# Patient Record
Sex: Male | Born: 1940 | Race: White | Hispanic: No | Marital: Married | Smoking: Former smoker
Health system: Southern US, Community
[De-identification: ages and names within clinical notes are randomized; demographics above are authoritative.]

## PROBLEM LIST (undated history)

## (undated) DIAGNOSIS — I10 Essential (primary) hypertension: Secondary | ICD-10-CM

## (undated) DIAGNOSIS — I700 Atherosclerosis of aorta: Secondary | ICD-10-CM

## (undated) DIAGNOSIS — E785 Hyperlipidemia, unspecified: Secondary | ICD-10-CM

## (undated) DIAGNOSIS — IMO0002 Reserved for concepts with insufficient information to code with codable children: Secondary | ICD-10-CM

## (undated) DIAGNOSIS — F329 Major depressive disorder, single episode, unspecified: Secondary | ICD-10-CM

## (undated) DIAGNOSIS — F32A Depression, unspecified: Secondary | ICD-10-CM

## (undated) DIAGNOSIS — Z8719 Personal history of other diseases of the digestive system: Secondary | ICD-10-CM

## (undated) DIAGNOSIS — N289 Disorder of kidney and ureter, unspecified: Secondary | ICD-10-CM

## (undated) DIAGNOSIS — M1990 Unspecified osteoarthritis, unspecified site: Secondary | ICD-10-CM

## (undated) DIAGNOSIS — N429 Disorder of prostate, unspecified: Secondary | ICD-10-CM

## (undated) DIAGNOSIS — K5790 Diverticulosis of intestine, part unspecified, without perforation or abscess without bleeding: Secondary | ICD-10-CM

## (undated) DIAGNOSIS — N189 Chronic kidney disease, unspecified: Secondary | ICD-10-CM

## (undated) HISTORY — DX: Disorder of prostate, unspecified: N42.9

## (undated) HISTORY — PX: SPINE SURGERY: SHX786

## (undated) HISTORY — DX: Major depressive disorder, single episode, unspecified: F32.9

## (undated) HISTORY — DX: Essential (primary) hypertension: I10

## (undated) HISTORY — PX: KYPHOPLASTY: SHX5884

## (undated) HISTORY — PX: CATARACT EXTRACTION: SUR2

## (undated) HISTORY — PX: HERNIA REPAIR: SHX51

## (undated) HISTORY — DX: Hyperlipidemia, unspecified: E78.5

## (undated) HISTORY — DX: Depression, unspecified: F32.A

---

## 2005-08-05 ENCOUNTER — Ambulatory Visit: Payer: Self-pay | Admitting: Unknown Physician Specialty

## 2005-08-13 ENCOUNTER — Ambulatory Visit: Payer: Self-pay | Admitting: Unknown Physician Specialty

## 2005-08-13 ENCOUNTER — Other Ambulatory Visit: Payer: Self-pay

## 2005-08-17 ENCOUNTER — Inpatient Hospital Stay: Payer: Self-pay | Admitting: Unknown Physician Specialty

## 2005-12-17 ENCOUNTER — Ambulatory Visit: Payer: Self-pay | Admitting: Unknown Physician Specialty

## 2009-04-17 ENCOUNTER — Ambulatory Visit: Payer: Self-pay | Admitting: Internal Medicine

## 2011-07-16 ENCOUNTER — Ambulatory Visit: Payer: Self-pay | Admitting: Unknown Physician Specialty

## 2011-11-02 ENCOUNTER — Ambulatory Visit: Payer: Self-pay | Admitting: Internal Medicine

## 2011-11-25 ENCOUNTER — Ambulatory Visit: Payer: Self-pay | Admitting: Internal Medicine

## 2012-06-28 ENCOUNTER — Emergency Department: Payer: Self-pay | Admitting: Emergency Medicine

## 2012-06-30 ENCOUNTER — Emergency Department: Payer: Self-pay | Admitting: Emergency Medicine

## 2012-07-28 ENCOUNTER — Encounter: Payer: Self-pay | Admitting: Physical Medicine & Rehabilitation

## 2012-08-01 ENCOUNTER — Ambulatory Visit: Payer: Worker's Compensation | Admitting: Physical Medicine & Rehabilitation

## 2012-08-07 ENCOUNTER — Encounter: Payer: Worker's Compensation | Attending: Physical Medicine & Rehabilitation

## 2012-08-07 ENCOUNTER — Encounter: Payer: Self-pay | Admitting: Physical Medicine & Rehabilitation

## 2012-08-07 ENCOUNTER — Ambulatory Visit (HOSPITAL_BASED_OUTPATIENT_CLINIC_OR_DEPARTMENT_OTHER): Payer: Worker's Compensation | Admitting: Physical Medicine & Rehabilitation

## 2012-08-07 ENCOUNTER — Other Ambulatory Visit: Payer: Self-pay | Admitting: Physical Medicine and Rehabilitation

## 2012-08-07 ENCOUNTER — Ambulatory Visit: Payer: Worker's Compensation | Admitting: Physical Medicine & Rehabilitation

## 2012-08-07 VITALS — BP 120/59 | HR 77 | Resp 14 | Ht 69.0 in | Wt 204.0 lb

## 2012-08-07 DIAGNOSIS — S139XXA Sprain of joints and ligaments of unspecified parts of neck, initial encounter: Secondary | ICD-10-CM

## 2012-08-07 DIAGNOSIS — E785 Hyperlipidemia, unspecified: Secondary | ICD-10-CM | POA: Insufficient documentation

## 2012-08-07 DIAGNOSIS — M5416 Radiculopathy, lumbar region: Secondary | ICD-10-CM

## 2012-08-07 DIAGNOSIS — S161XXA Strain of muscle, fascia and tendon at neck level, initial encounter: Secondary | ICD-10-CM | POA: Insufficient documentation

## 2012-08-07 DIAGNOSIS — M47812 Spondylosis without myelopathy or radiculopathy, cervical region: Secondary | ICD-10-CM | POA: Insufficient documentation

## 2012-08-07 DIAGNOSIS — M549 Dorsalgia, unspecified: Secondary | ICD-10-CM

## 2012-08-07 DIAGNOSIS — IMO0002 Reserved for concepts with insufficient information to code with codable children: Secondary | ICD-10-CM

## 2012-08-07 DIAGNOSIS — W1789XA Other fall from one level to another, initial encounter: Secondary | ICD-10-CM | POA: Insufficient documentation

## 2012-08-07 DIAGNOSIS — M5136 Other intervertebral disc degeneration, lumbar region: Secondary | ICD-10-CM

## 2012-08-07 DIAGNOSIS — M5137 Other intervertebral disc degeneration, lumbosacral region: Secondary | ICD-10-CM

## 2012-08-07 DIAGNOSIS — M48061 Spinal stenosis, lumbar region without neurogenic claudication: Secondary | ICD-10-CM | POA: Insufficient documentation

## 2012-08-07 DIAGNOSIS — I10 Essential (primary) hypertension: Secondary | ICD-10-CM | POA: Insufficient documentation

## 2012-08-07 MED ORDER — IBUPROFEN 600 MG PO TABS: 600.0000 mg | ORAL_TABLET | Freq: Three times a day (TID) | ORAL | Status: DC | PRN

## 2012-08-07 MED ORDER — TRAMADOL HCL 50 MG PO TABS: 50.0000 mg | ORAL_TABLET | Freq: Three times a day (TID) | ORAL | Status: DC | PRN

## 2012-08-07 NOTE — Progress Notes (Signed)
Subjective:    Patient ID: Shawn Stafford, male    DOB: April 25, 1941, 71 y.o.   MRN: [id number]  HPI 71 year old male who fell backward off the steps of his tractor trailer onto packed dirt on 06/27/2012. He had immediate onset of neck pain and low back pain. He had no radiating symptoms in his upper extremities. He's had pre-existing problems of numbness in both lower extremities particularly the lateral thighs. He had epidural for numbness in his legs in March of 2013 by Dr. Sharlet Salina which was beneficial. He also complains of pain around the left shoulder blade. It feels like it is underneath the shoulder blade. He underwent cervical thoracic and lumbar MRI. I reviewed both report as well as actual films. The thoracic MRI was normal. The cervical MRI With disc osteophyte complex C4-5 and C5-C6. No significant stenosis. Lumbar MRI showed disc protrusions at L3-L4 greater than L2-L3 greater than L4-L5 and L5-S1. No prior films are available for comparison Pain Inventory Average Pain 8 Pain Right Now 7 My pain is burning and aching  In the last 24 hours, has pain interfered with the following? General activity 6 Relation with others 2 Enjoyment of life 6 What TIME of day is your pain at its worst? day and evening Sleep (in general) Fair  Pain is worse with: walking, bending, sitting and standing Pain improves with: rest and medication Relief from Meds: 5  Mobility walk without assistance how many minutes can you walk? 10 ability to climb steps?  yes do you drive?  yes  Function employed # of hrs/week part time retired  Neuro/Psych bladder control problems weakness numbness trouble walking  Prior Studies x-rays  Physicians involved in your care Any changes since last visit?  no   Family History  Problem Relation Age of Onset  . Dementia Mother   . Cancer Father    History   Social History  . Marital Status: Married    Spouse Name: N/A    Number of Children: N/A  .  Years of Education: N/A   Social History Main Topics  . Smoking status: Never Smoker   . Smokeless tobacco: Never Used  . Alcohol Use: No  . Drug Use: No  . Sexually Active: None   Other Topics Concern  . None   Social History Narrative  . None   Past Surgical History  Procedure Date  . Spine surgery   . Hernia repair    Past Medical History  Diagnosis Date  . Hyperlipidemia   . Hypertension   . Prostate disorder   . Depression    Ht 5\' 9"  (1.753 m)  Wt 204 lb (92.534 kg)  BMI 30.13 kg/m2     Review of Systems  HENT: Positive for neck pain.   Musculoskeletal: Positive for myalgias, back pain and arthralgias.  Neurological: Positive for weakness and numbness.  All other systems reviewed and are negative.       Objective:   Physical Exam  Constitutional: He is oriented to person, place, and time. He appears well-developed and well-nourished.  HENT:  Head: Normocephalic and atraumatic.  Eyes: Conjunctivae normal and EOM are normal. Pupils are equal, round, and reactive to light.  Pulmonary/Chest: Breath sounds normal.  Neurological: He is alert and oriented to person, place, and time. He has normal strength. He displays no atrophy. A sensory deficit is present. He exhibits normal muscle tone. Coordination and gait normal.  Reflex Scores:      Tricep reflexes are 1+ on  the right side and 1+ on the left side.      Bicep reflexes are 1+ on the right side and 1+ on the left side.      Brachioradialis reflexes are 1+ on the right side and 1+ on the left side.      Patellar reflexes are 2+ on the right side and 2+ on the left side.      Achilles reflexes are 0 on the right side and 2+ on the left side.      Decreased bilateral L5, Decreased right L4 decreased bilateral L2-L3 sensation to pinprick  Psychiatric: He has a normal mood and affect.   No tenderness to palpation over the scapula       Assessment & Plan:  1.Cervical StrainIn the setting of cervical  spondylosis with work related injury resulting from fall onto back. There is no evidence of cervical radiculitis. MRI showing chronic changes Most notably at C4-5 and C5-C6 2. Lumbar spinal stenosis. He has chronic complaints that refer to the L3 dermatomal distribution. He has sensory findings at L4 on the right and bilateral L5 and reflex changes right S1. It is difficult to state whether these are old or new. Previous MRI would be helpful as well as previous notes however he does have aggravation due to the recent fall and requires treatment.. No further imaging studies recommended Agree with current medications. We'll take the tramadol 50 mg every 8 hours when necessary Ibuprofen 600 mg 3 times a day with food Gabapentin was a medication he has been on chronically for his chronic radicular type symptoms he is to continue that as per his physician's orders We will start him in physical therapy to help mobilize and increase his sitting tolerance. I'll see him back in approximately 2 weeks. I hope to release him to at least part-time work at that time. In the meantime he is off of work completely If he is not making progress as expected he may benefit from lumbar medial branch blocks versus lumbar epidural injection

## 2012-08-07 NOTE — Patient Instructions (Signed)
Please take the ibuprofen 600 mg 3 times per day with food Continue tramadol 50 mg 3 times a day as needed Continue gabapentin as prescribed by her other doctor Physical therapy 3 times per week until I see you next No work until I see you next. The goal would be to return you to some limited driving once your sitting tolerance improves

## 2012-08-24 ENCOUNTER — Encounter: Payer: Worker's Compensation | Attending: Physical Medicine & Rehabilitation

## 2012-08-24 ENCOUNTER — Ambulatory Visit (HOSPITAL_BASED_OUTPATIENT_CLINIC_OR_DEPARTMENT_OTHER): Payer: Worker's Compensation | Admitting: Physical Medicine & Rehabilitation

## 2012-08-24 ENCOUNTER — Encounter: Payer: Self-pay | Admitting: Physical Medicine & Rehabilitation

## 2012-08-24 VITALS — BP 146/79 | HR 69 | Resp 14 | Ht 69.0 in | Wt 204.0 lb

## 2012-08-24 DIAGNOSIS — M47812 Spondylosis without myelopathy or radiculopathy, cervical region: Secondary | ICD-10-CM | POA: Insufficient documentation

## 2012-08-24 DIAGNOSIS — M47816 Spondylosis without myelopathy or radiculopathy, lumbar region: Secondary | ICD-10-CM

## 2012-08-24 DIAGNOSIS — M48061 Spinal stenosis, lumbar region without neurogenic claudication: Secondary | ICD-10-CM | POA: Insufficient documentation

## 2012-08-24 DIAGNOSIS — E785 Hyperlipidemia, unspecified: Secondary | ICD-10-CM | POA: Insufficient documentation

## 2012-08-24 DIAGNOSIS — W1789XA Other fall from one level to another, initial encounter: Secondary | ICD-10-CM | POA: Insufficient documentation

## 2012-08-24 DIAGNOSIS — S161XXA Strain of muscle, fascia and tendon at neck level, initial encounter: Secondary | ICD-10-CM

## 2012-08-24 DIAGNOSIS — S139XXA Sprain of joints and ligaments of unspecified parts of neck, initial encounter: Secondary | ICD-10-CM | POA: Insufficient documentation

## 2012-08-24 DIAGNOSIS — I10 Essential (primary) hypertension: Secondary | ICD-10-CM | POA: Insufficient documentation

## 2012-08-24 DIAGNOSIS — M47817 Spondylosis without myelopathy or radiculopathy, lumbosacral region: Secondary | ICD-10-CM

## 2012-08-24 NOTE — Progress Notes (Signed)
  Subjective:    Patient ID: Shawn Stafford, male    DOB: 08/19/1941, 71 y.o.   MRN: [id number]  HPI  Pain Inventory Average Pain 8 Pain Right Now 6 My pain is constant, sharp and burning  In the last 24 hours, has pain interfered with the following? General activity 5 Relation with others 3 Enjoyment of life 4 What TIME of day is your pain at its worst? evening Sleep (in general) Fair  Pain is worse with: bending and standing Pain improves with: rest and pacing activities Relief from Meds: 5  Mobility walk without assistance how many minutes can you walk? 10 ability to climb steps?  yes do you drive?  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  no  Physicians involved in your care Any changes since last visit?  no   Family History  Problem Relation Age of Onset  . Dementia Mother   . Cancer Father    History   Social History  . Marital Status: Married    Spouse Name: N/A    Number of Children: N/A  . Years of Education: N/A   Social History Main Topics  . Smoking status: Never Smoker   . Smokeless tobacco: Never Used  . Alcohol Use: No  . Drug Use: No  . Sexually Active: None   Other Topics Concern  . None   Social History Narrative  . None   Past Surgical History  Procedure Date  . Spine surgery   . Hernia repair    Past Medical History  Diagnosis Date  . Hyperlipidemia   . Hypertension   . Prostate disorder   . Depression    BP 146/79  Pulse 69  Resp 14  Ht 5\' 9"  (1.753 m)  Wt 204 lb (92.534 kg)  BMI 30.13 kg/m2  SpO2 93%     Review of Systems  Musculoskeletal: Positive for myalgias, back pain and arthralgias.  All other systems reviewed and are negative.       Objective:   Physical Exam        Assessment & Plan:

## 2012-08-24 NOTE — Progress Notes (Signed)
Subjective:    Patient ID: Shawn Stafford, male    DOB: 1941-04-22, 71 y.o.   MRN: [id number]  HPI  71 year old male who fell backward off the steps of his tractor trailer onto packed dirt on 06/27/2012. He had immediate onset of neck pain and low back pain. He had no radiating symptoms in his upper extremities. He's had pre-existing problems of numbness in both lower extremities particularly the lateral thighs. He had epidural for numbness in his legs in March of 2013 by Dr. Sharlet Salina which was beneficial.  He also complains of pain around the left shoulder blade. It feels like it is underneath the shoulder blade. He underwent cervical thoracic and lumbar MRI. I reviewed both report as well as actual films. The thoracic MRI was normal. The cervical MRI With disc osteophyte complex C4-5 and C5-C6. No significant stenosis.  Lumbar MRI showed disc protrusions at L3-L4 greater than L2-L3 greater than L4-L5 and L5-S1.  Complains of tingling and burning in front of the thighs with standing as chief complaint Neck feels better 5/10still has some pain around the left shoulder but blade  Low back pain is also rated as severe 5/10  Review of Systems     Objective:   Physical Exam  Physical Exam  Constitutional: He is oriented to person, place, and time. He appears well-developed and well-nourished.  HENT:  Head: Normocephalic and atraumatic.  Eyes: Conjunctivae normal and EOM are normal. Pupils are equal, round, and reactive to light.  Pulmonary/Chest: Breath sounds normal.  Neurological: He is alert and oriented to person, place, and time. He has normal strength. He displays no atrophy. A sensory deficit is present. He exhibits normal muscle tone. Coordination and gait normal.  Reflex Scores:  Tricep reflexes are 1+ on the right side and 1+ on the left side.  Bicep reflexes are 1+ on the right side and 1+ on the left side.  Brachioradialis reflexes are 1+ on the right side and 1+ on the left side.    Patellar reflexes are 2+ on the right side and 2+ on the left side.  Achilles reflexes are 0 on the right side and 2+ on the left side. Decreased Right L5, Decreased right L4 decreased bilateral L2-L3 sensation to pinprick  Psychiatric: He has a normal mood and affect.   No tenderness to palpation over the scapula       Assessment & Plan:  1.Cervical StrainIn the setting of cervical spondylosis with work related injury resulting from fall onto back. There is no evidence of cervical radiculitis. MRI showing chronic changes Most notably at C4-5 and C5-C6  2. Lumbar spinal stenosis. He has chronic complaints that refer to the L3 dermatomal distribution. He has sensory findings at L4 on the right and bilateral L5 and reflex changes right S1. It is difficult to state whether these are old or new. Previous MRI would be helpful as well as previous notes however he does have aggravation due to the recent fall and requires treatment..  No further imaging studies recommended  Agree with current medications. We'll take the tramadol 50 mg every 8 hours when necessary  Ibuprofen 600 mg 3 times a day with food  Gabapentin was a medication he has been on chronically for his chronic radicular type symptoms he is to continue that as per his physician's orders  We will start him in physical therapy to help mobilize and increase his sitting tolerance.  Limited work hours 20 hours. Driving okay Continue physical therapy Medial  branch blocks bilateral L3-L4-L5, 2wks Continue current medications including tramadol 50 mg every 6-8 hours as needed and ibuprofen 600 mg 3 times per day with food

## 2012-08-24 NOTE — Patient Instructions (Signed)
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

## 2012-09-05 ENCOUNTER — Ambulatory Visit (HOSPITAL_BASED_OUTPATIENT_CLINIC_OR_DEPARTMENT_OTHER): Payer: Worker's Compensation | Admitting: Physical Medicine & Rehabilitation

## 2012-09-05 ENCOUNTER — Encounter: Payer: Self-pay | Admitting: Physical Medicine & Rehabilitation

## 2012-09-05 VITALS — BP 111/57 | HR 78 | Resp 14 | Ht 69.0 in | Wt 199.0 lb

## 2012-09-05 DIAGNOSIS — M47817 Spondylosis without myelopathy or radiculopathy, lumbosacral region: Secondary | ICD-10-CM

## 2012-09-05 MED ORDER — TRAZODONE HCL 50 MG PO TABS: 50.0000 mg | ORAL_TABLET | Freq: Every day | ORAL | Status: DC

## 2012-09-05 NOTE — Progress Notes (Signed)

## 2012-09-05 NOTE — Patient Instructions (Addendum)
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

## 2012-09-05 NOTE — Progress Notes (Signed)
  [facility]   Name: Shawn Stafford DOB:11/24/1940 MRN: [id number]  Date:09/05/2012  Physician: Alysia Penna, MD    Nurse/CMA: Amerie Beaumont,CMA/Carroll, CMA  Allergies: No Known Allergies  Consent Signed: yes  Is patient diabetic? no    Pregnant: no LMP: No LMP for male patient. (age 72-55)  Anticoagulants: no Anti-inflammatory: no Antibiotics: no  Procedure: Medial Branch Block  Position: Prone Start Time: 3:42pm End Time: 3:53pm  Fluoro Time: 36 seconds  RN/CMA Freddie Nghiem,CMA Carroll,CMA    Time 323pm 3:56pm    BP 111/57 150/59    Pulse 78 72    Respirations 14 14    O2 Sat 96 94%    S/S 6 6    Pain Level 8/10 8/10     D/C home with [name], patient A & O X 3, D/C instructions reviewed, and sits independently.

## 2012-09-21 ENCOUNTER — Encounter: Payer: Self-pay | Admitting: Physical Medicine & Rehabilitation

## 2012-09-21 ENCOUNTER — Ambulatory Visit (HOSPITAL_BASED_OUTPATIENT_CLINIC_OR_DEPARTMENT_OTHER): Payer: Worker's Compensation | Admitting: Physical Medicine & Rehabilitation

## 2012-09-21 VITALS — BP 124/55 | HR 86 | Resp 14 | Ht 69.0 in | Wt 198.8 lb

## 2012-09-21 DIAGNOSIS — M533 Sacrococcygeal disorders, not elsewhere classified: Secondary | ICD-10-CM

## 2012-09-21 DIAGNOSIS — M47817 Spondylosis without myelopathy or radiculopathy, lumbosacral region: Secondary | ICD-10-CM

## 2012-09-21 DIAGNOSIS — M47816 Spondylosis without myelopathy or radiculopathy, lumbar region: Secondary | ICD-10-CM | POA: Insufficient documentation

## 2012-09-21 MED ORDER — TRAMADOL HCL 50 MG PO TABS: 50.0000 mg | ORAL_TABLET | Freq: Three times a day (TID) | ORAL | Status: DC | PRN

## 2012-09-21 MED ORDER — TRAZODONE HCL 50 MG PO TABS: 50.0000 mg | ORAL_TABLET | Freq: Every day | ORAL | Status: DC

## 2012-09-21 NOTE — Patient Instructions (Signed)
Sacroiliac Joint Dysfunction The sacroiliac joint connects the lower part of the spine (the sacrum) with the bones of the pelvis. CAUSES  Sometimes, there is no obvious reason for sacroiliac joint dysfunction. Other times, it may occur   During pregnancy.  After injury, such as:  Car accidents.  Sport-related injuries.  Work-related injuries.  Due to one leg being shorter than the other.  Due to other conditions that affect the joints, such as:  Rheumatoid arthritis.  Gout.  Psoriasis.  Joint infection (septic arthritis). SYMPTOMS  Symptoms may include:  Pain in the:  Lower back.  Buttocks.  Groin.  Thighs and legs.  Difficult sitting, standing, walking, lying, bending or lifting. DIAGNOSIS  A number of tests may be used to help diagnose the cause of sacroiliac joint dysfunction, including:  Imaging tests to look for other causes of pain, including:  MRI.  CT scan.  Bone scan.  Diagnostic injection: During a special x-ray (called fluoroscopy), a needle is put into the sacroiliac joint. A numbing medicine is injected into the joint. If the pain is improved or stopped, the diagnosis of sacroiliac joint dysfunction is more likely. TREATMENT  There are a number of types of treatment used for sacroiliac joint dysfunction, including:  Only take over-the-counter or prescription medicines for pain, discomfort, or fever as directed by your caregiver.  Medications to relax muscles.  Rest. Decreasing activity can help cut down on painful muscle spasms and allow the back to heal.  Application of heat or ice to the lower back may improve muscle spasms and soothe pain.  Brace. A special back brace, called a sacroiliac belt, can help support the joint while your back is healing.  Physical therapy can help teach comfortable positions and exercises to strengthen muscles that support the sacroiliac joint.  Cortisone injections. Injections of steroid medicine into the  joint can help decrease swelling and improve pain.  Hyaluronic acid injections. This chemical improves lubrication within the sacroiliac joint, thereby decreasing pain.  Radiofrequency ablation. A special needle is placed into the joint, where it burns away nerves that are carrying pain messages from the joint.  Surgery. Because pain occurs during movement of the joint, screws and plates may be installed in order to limit or prevent joint motion. HOME CARE INSTRUCTIONS   Take all medications exactly as directed.  Follow instructions regarding both rest and physical activity, to avoid worsening the pain.  Do physical therapy exercises exactly as prescribed. SEEK IMMEDIATE MEDICAL CARE IF:  You experience increasingly severe pain.  You develop new symptoms, such as numbness or tingling in your legs or feet.  You lose bladder or bowel control. Document Released: 02/04/2009 Document Revised: 01/31/2012 Document Reviewed: 02/04/2009 [facility] Patient Information 2013 [place].

## 2012-09-21 NOTE — Progress Notes (Signed)
Subjective:    Patient ID: Shawn Stafford, male    DOB: 12-Jul-1941, 71 y.o.   MRN: [id number] 71 year old male who fell backward off the steps of his tractor trailer onto packed dirt on 06/27/2012. He had immediate onset of neck pain and low back pain. He had no radiating symptoms in his upper extremities. He's had pre-existing problems of numbness in both lower extremities particularly the lateral thighs. He had epidural for numbness in his legs in March of 2013 by Dr. Sharlet Salina which was beneficial.  He also complains of pain around the left shoulder blade. It feels like it is underneath the shoulder blade. He underwent cervical thoracic and lumbar MRI. I reviewed both report as well as actual films. The thoracic MRI was normal. The cervical MRI With disc osteophyte complex C4-5 and C5-C6. No significant stenosis.  Lumbar MRI showed disc protrusions at L3-L4 greater than L2-L3 greater than L4-L5 and L5-S1. HPI  Pain Inventory Average Pain 6 Pain Right Now 6 My pain is burning, dull and aching  In the last 24 hours, has pain interfered with the following? General activity 6 Relation with others 0 Enjoyment of life 6 What TIME of day is your pain at its worst? morning and evening Sleep (in general) Poor  Pain is worse with: bending, sitting and standing Pain improves with: heat/ice, therapy/exercise and TENS Relief from Meds: 5  Mobility walk without assistance ability to climb steps?  yes do you drive?  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  no  Physicians involved in your care Any changes since last visit?  no   Family History  Problem Relation Age of Onset  . Dementia Mother   . Cancer Father    History   Social History  . Marital Status: Married    Spouse Name: N/A    Number of Children: N/A  . Years of Education: N/A   Social History Main  Topics  . Smoking status: Never Smoker   . Smokeless tobacco: Never Used  . Alcohol Use: No  . Drug Use: No  . Sexually Active: None   Other Topics Concern  . None   Social History Narrative  . None   Past Surgical History  Procedure Date  . Spine surgery   . Hernia repair    Past Medical History  Diagnosis Date  . Hyperlipidemia   . Hypertension   . Prostate disorder   . Depression    BP 124/55  Pulse 86  Resp 14  Ht 5\' 9"  (1.753 m)  Wt 198 lb 12.8 oz (90.175 kg)  BMI 29.36 kg/m2  SpO2 94%    Review of Systems  Musculoskeletal: Positive for back pain.  Neurological: Positive for weakness and numbness.       Tingling  All other systems reviewed and are negative.       Objective:   Physical Exam  Constitutional: He is oriented to person, place, and time. He appears well-developed and well-nourished.  HENT:  Head: Normocephalic and atraumatic.  Eyes: EOM are normal. Pupils are equal, round, and reactive to light.  Musculoskeletal:       Lumbar back: He exhibits decreased range of motion.  Neurological: He is alert and oriented to person, place, and time. He has normal strength and normal reflexes. Gait normal.  Psychiatric: He has a normal mood and affect.   Tenderness over  the left PSIS area Range of motion in the lumbar spine is limited and is painful more with extension than with flexion Hip range of motion is full No pain with hip range of motion Straight leg raising test is negative Lower extremity strength is normal General no acute distress       Assessment & Plan:  1.Cervical StrainIn the setting of cervical spondylosis/Cervical degenerative disc with work related injury resulting from fall onto back. There is no evidence of cervical radiculitis. MRI showing chronic changes Most notably at C4-5 and C5-C6  He is responding to physical therapy. I would recommend traction as well as soft tissue massage and mobilization. We discussed that if he  does not respond well to physical therapy at limited trial of chiropractic adjustment may be helpful 2. Lumbar spinal stenosis. He has chronic complaints that refer to the L3 dermatomal distribution. He has sensory findings at L4 on the right and bilateral L5 and reflex changes right S1. It is difficult to state whether these are old or new. 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 He does have pain that is localized to the left PSIS area. This certainly could have been injured in the fall as well.  Recommend physical therapy mobilization as well as strengthening around the hip girdle area.2-3 times a week until I see him next in 2 weeks  Left sacroiliac injection under fluoroscopic guidance   No further imaging studies recommended   Agree with current medications. We'll take the tramadol 50 mg every 8 hours when necessary  Ibuprofen 600 mg 3 times a day with food   Gabapentin was a medication he has been on chronically for his chronic radicular type symptoms he is to continue that as per his physician's orders  .  Limited work hours But extend to 25 hours. Driving okay Continue physical therapy:Details above

## 2012-09-28 ENCOUNTER — Ambulatory Visit: Payer: Worker's Compensation | Admitting: Physical Medicine & Rehabilitation

## 2012-10-10 ENCOUNTER — Encounter: Payer: Self-pay | Admitting: Physical Medicine & Rehabilitation

## 2012-10-10 ENCOUNTER — Ambulatory Visit (HOSPITAL_BASED_OUTPATIENT_CLINIC_OR_DEPARTMENT_OTHER): Payer: Worker's Compensation | Admitting: Physical Medicine & Rehabilitation

## 2012-10-10 ENCOUNTER — Telehealth: Payer: Self-pay

## 2012-10-10 ENCOUNTER — Ambulatory Visit: Payer: Worker's Compensation | Admitting: Physical Medicine & Rehabilitation

## 2012-10-10 ENCOUNTER — Encounter: Payer: Worker's Compensation | Attending: Physical Medicine & Rehabilitation

## 2012-10-10 VITALS — BP 143/65 | HR 67 | Resp 14 | Ht 69.0 in | Wt 198.0 lb

## 2012-10-10 DIAGNOSIS — I10 Essential (primary) hypertension: Secondary | ICD-10-CM | POA: Insufficient documentation

## 2012-10-10 DIAGNOSIS — E785 Hyperlipidemia, unspecified: Secondary | ICD-10-CM | POA: Insufficient documentation

## 2012-10-10 DIAGNOSIS — M47812 Spondylosis without myelopathy or radiculopathy, cervical region: Secondary | ICD-10-CM | POA: Insufficient documentation

## 2012-10-10 DIAGNOSIS — W1789XA Other fall from one level to another, initial encounter: Secondary | ICD-10-CM | POA: Insufficient documentation

## 2012-10-10 DIAGNOSIS — M533 Sacrococcygeal disorders, not elsewhere classified: Secondary | ICD-10-CM

## 2012-10-10 DIAGNOSIS — S139XXA Sprain of joints and ligaments of unspecified parts of neck, initial encounter: Secondary | ICD-10-CM | POA: Insufficient documentation

## 2012-10-10 DIAGNOSIS — M48061 Spinal stenosis, lumbar region without neurogenic claudication: Secondary | ICD-10-CM | POA: Insufficient documentation

## 2012-10-10 NOTE — Patient Instructions (Signed)
Sacroiliac Joint Dysfunction The sacroiliac joint connects the lower part of the spine (the sacrum) with the bones of the pelvis. CAUSES  Sometimes, there is no obvious reason for sacroiliac joint dysfunction. Other times, it may occur   During pregnancy.  After injury, such as:  Car accidents.  Sport-related injuries.  Work-related injuries.  Due to one leg being shorter than the other.  Due to other conditions that affect the joints, such as:  Rheumatoid arthritis.  Gout.  Psoriasis.  Joint infection (septic arthritis). SYMPTOMS  Symptoms may include:  Pain in the:  Lower back.  Buttocks.  Groin.  Thighs and legs.  Difficult sitting, standing, walking, lying, bending or lifting. DIAGNOSIS  A number of tests may be used to help diagnose the cause of sacroiliac joint dysfunction, including:  Imaging tests to look for other causes of pain, including:  MRI.  CT scan.  Bone scan.  Diagnostic injection: During a special x-ray (called fluoroscopy), a needle is put into the sacroiliac joint. A numbing medicine is injected into the joint. If the pain is improved or stopped, the diagnosis of sacroiliac joint dysfunction is more likely. TREATMENT  There are a number of types of treatment used for sacroiliac joint dysfunction, including:  Only take over-the-counter or prescription medicines for pain, discomfort, or fever as directed by your caregiver.  Medications to relax muscles.  Rest. Decreasing activity can help cut down on painful muscle spasms and allow the back to heal.  Application of heat or ice to the lower back may improve muscle spasms and soothe pain.  Brace. A special back brace, called a sacroiliac belt, can help support the joint while your back is healing.  Physical therapy can help teach comfortable positions and exercises to strengthen muscles that support the sacroiliac joint.  Cortisone injections. Injections of steroid medicine into the  joint can help decrease swelling and improve pain.  Hyaluronic acid injections. This chemical improves lubrication within the sacroiliac joint, thereby decreasing pain.  Radiofrequency ablation. A special needle is placed into the joint, where it burns away nerves that are carrying pain messages from the joint.  Surgery. Because pain occurs during movement of the joint, screws and plates may be installed in order to limit or prevent joint motion. HOME CARE INSTRUCTIONS   Take all medications exactly as directed.  Follow instructions regarding both rest and physical activity, to avoid worsening the pain.  Do physical therapy exercises exactly as prescribed. SEEK IMMEDIATE MEDICAL CARE IF:  You experience increasingly severe pain.  You develop new symptoms, such as numbness or tingling in your legs or feet.  You lose bladder or bowel control. Document Released: 02/04/2009 Document Revised: 01/31/2012 Document Reviewed: 02/04/2009 [facility] Patient Information 2013 [place].

## 2012-10-10 NOTE — Progress Notes (Signed)
  [facility]   Name: Shawn Stafford DOB:Feb 18, 1941 MRN: [id number]  Date:10/10/2012  Physician: Alysia Penna, MD    Nurse/CMA: Vonna Drafts, CMA/Walker, CMA  Allergies: No Known Allergies  Consent Signed: yes  Is patient diabetic? no   Pregnant: no LMP: No LMP for male patient. (age 72-55)  Anticoagulants: no Anti-inflammatory: yes (600mg  motrin 10/10/12 at 9 am) Antibiotics: no  Procedure: Left Sacroiliac Steroid injection   Position: Prone Start Time:1:46   End Time: 1:49  Fluoro Time: 11 seconds  RN/CMA Namish Krise, CMA Shumaker RN    Time 1:02pm 1:55    BP 143/65 139/68    Pulse 68 62    Respirations 14 14    O2 Sat 95 92    S/S 6 6    Pain Level 6/10 0/10     D/C home with Wife-[name], patient A & O X 3, D/C instructions reviewed, and sits independently.

## 2012-10-10 NOTE — Telephone Encounter (Signed)
Patient was not sure about restriction with work and physical therapy.  Advised him he could return as he was before.

## 2012-10-10 NOTE — Progress Notes (Signed)
Left sacroiliac injection under fluoroscopic guidance  Indication: Left Low back and buttocks pain not relieved by medication management and other conservative care.  Informed consent was obtained after describing risks and benefits of the procedure with the patient, this includes bleeding, bruising, infection, paralysis and medication side effects. The patient wishes to proceed and has given written consent. The patient was placed in a prone position. The lumbar and sacral area was marked and prepped with Betadine. A 25-gauge 1-1/2 inch needle was inserted into the skin and subcutaneous tissue and 1 mL of 1% lidocaine was injected. Then a 25-gauge 3 inch spinal needle was inserted under fluoroscopic guidance into the left sacroiliac joint. AP and lateral images were utilized. Omnipaque 180x0.5 mL under live fluoroscopy demonstrated no intravascular uptake. Then a solution containing one ML of 40 mg per mL depomedrol and 2 ML of 1% lidocaine MPF was injected x1.5 mL. Patient tolerated the procedure well. Post procedure instructions were given. Please see post procedure form. 

## 2012-10-24 ENCOUNTER — Ambulatory Visit (HOSPITAL_BASED_OUTPATIENT_CLINIC_OR_DEPARTMENT_OTHER): Payer: Worker's Compensation | Admitting: Physical Medicine & Rehabilitation

## 2012-10-24 ENCOUNTER — Encounter: Payer: Worker's Compensation | Attending: Physical Medicine & Rehabilitation

## 2012-10-24 ENCOUNTER — Encounter: Payer: Self-pay | Admitting: Physical Medicine & Rehabilitation

## 2012-10-24 VITALS — BP 107/57 | HR 74 | Resp 14 | Ht 69.0 in | Wt 201.0 lb

## 2012-10-24 DIAGNOSIS — M47812 Spondylosis without myelopathy or radiculopathy, cervical region: Secondary | ICD-10-CM | POA: Insufficient documentation

## 2012-10-24 DIAGNOSIS — W1789XA Other fall from one level to another, initial encounter: Secondary | ICD-10-CM | POA: Insufficient documentation

## 2012-10-24 DIAGNOSIS — M533 Sacrococcygeal disorders, not elsewhere classified: Secondary | ICD-10-CM

## 2012-10-24 DIAGNOSIS — S139XXA Sprain of joints and ligaments of unspecified parts of neck, initial encounter: Secondary | ICD-10-CM | POA: Insufficient documentation

## 2012-10-24 DIAGNOSIS — M47817 Spondylosis without myelopathy or radiculopathy, lumbosacral region: Secondary | ICD-10-CM

## 2012-10-24 DIAGNOSIS — M48061 Spinal stenosis, lumbar region without neurogenic claudication: Secondary | ICD-10-CM | POA: Insufficient documentation

## 2012-10-24 DIAGNOSIS — E785 Hyperlipidemia, unspecified: Secondary | ICD-10-CM | POA: Insufficient documentation

## 2012-10-24 DIAGNOSIS — M47816 Spondylosis without myelopathy or radiculopathy, lumbar region: Secondary | ICD-10-CM

## 2012-10-24 DIAGNOSIS — I10 Essential (primary) hypertension: Secondary | ICD-10-CM | POA: Insufficient documentation

## 2012-10-24 NOTE — Progress Notes (Addendum)
Subjective:    Patient ID: Shawn Stafford, male    DOB: [birth year], 71 y.o.   MRN: [id number] 71 year old male who fell backward off the steps of his tractor trailer onto packed dirt on 06/27/2012. He had immediate onset of neck pain and low back pain. He had no radiating symptoms in his upper extremities. He's had pre-existing problems of numbness in both lower extremities particularly the lateral thighs. He had epidural for numbness in his legs in March of 2013 by Dr. Sharlet Salina which was beneficial.  He also complains of pain around the left shoulder blade. It feels like it is underneath the shoulder blade. He underwent cervical thoracic and lumbar MRI. I reviewed both report as well as actual films. The thoracic MRI was normal. The cervical MRI With disc osteophyte complex C4-5 and C5-C6. No significant stenosis.  Lumbar MRI showed disc protrusions at L3-L4 greater than L2-L3 greater than L4-L5 and L5-S1 HPI Left SI injection helpful Has pain on R side in a similar location  Driving at work.  7.5 hours yesterday.  Working beyond 5 hour max days Pain Inventory Average Pain 5 Pain Right Now 6 My pain is sharp and aching  In the last 24 hours, has pain interfered with the following? General activity 6 Relation with others 2 Enjoyment of life 6 What TIME of day is your pain at its worst? daytime Sleep (in general) Fair  Pain is worse with: walking, bending, standing and some activites Pain improves with: rest, heat/ice, medication, TENS and injections Relief from Meds: 5  Mobility walk without assistance how many minutes can you walk? 5-10 ability to climb steps?  yes do you drive?  yes Do you have any goals in this area?  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  no  Physicians involved in your care Any changes since last visit?  no   Family History  Problem Relation Age of Onset   . Dementia Mother   . Cancer Father    History   Social History  . Marital Status: Married    Spouse Name: N/A    Number of Children: N/A  . Years of Education: N/A   Social History Main Topics  . Smoking status: Never Smoker   . Smokeless tobacco: Never Used  . Alcohol Use: No  . Drug Use: No  . Sexually Active: None   Other Topics Concern  . None   Social History Narrative  . None   Past Surgical History  Procedure Date  . Spine surgery   . Hernia repair    Past Medical History  Diagnosis Date  . Hyperlipidemia   . Hypertension   . Prostate disorder   . Depression    BP 99/61  Pulse 74  Resp 14  Ht 5\' 9"  (1.753 m)  Wt 201 lb (91.173 kg)  BMI 29.68 kg/m2  SpO2 95%     Review of Systems  Musculoskeletal: Positive for back pain.  Neurological: Positive for weakness and numbness.  All other systems reviewed and are negative.       Objective:   Physical Exam   Tenderness over the left PSIS area Range of motion in the lumbar spine is limited and is painful more with extension than with flexion Hip range of motion is full No pain with hip range of motion Straight leg raising test is negative Lower extremity strength is normal General no acute  distress      Assessment & Plan:  1.Cervical StrainIn the setting of cervical spondylosis/Cervical degenerative disc with work related injury resulting from fall onto back. There is no evidence of cervical radiculitis. MRI showing chronic changes Most notably at C4-5 and C5-C6  He is responding to physical therapy.The symptoms have largely resolved 2. Lumbar spinal stenosis. He has chronic complaints that refer to the L3 dermatomal distribution. He has sensory findings at L4 on the right and bilateral L5 and reflex changes right S1. It is difficult to state whether these are old or new. 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 He does had pain that was localized to the left PSIS area. This responded to Left SI joint injection.  Similar findings now noted on the right Recommend physical therapy mobilization as well as strengthening around the hip girdle area.2-3 times a week Left sacroiliac injection under fluoroscopic guidance   No further imaging studies recommended   Agree with current medications. 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

## 2012-10-24 NOTE — Patient Instructions (Signed)
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

## 2012-11-07 ENCOUNTER — Ambulatory Visit (HOSPITAL_BASED_OUTPATIENT_CLINIC_OR_DEPARTMENT_OTHER): Payer: Worker's Compensation | Admitting: Physical Medicine & Rehabilitation

## 2012-11-07 ENCOUNTER — Encounter: Payer: Self-pay | Admitting: Physical Medicine & Rehabilitation

## 2012-11-07 VITALS — BP 124/66 | HR 87 | Resp 14 | Ht 69.0 in | Wt 200.0 lb

## 2012-11-07 DIAGNOSIS — M533 Sacrococcygeal disorders, not elsewhere classified: Secondary | ICD-10-CM

## 2012-11-07 NOTE — Patient Instructions (Signed)
Continue therapy at [employer] Increase work hours to 30 per week See me in 3 weeks

## 2012-11-07 NOTE — Progress Notes (Signed)
Bilateral sacroiliac injection under fluoroscopic guidance  Indication: Bilateral Low back and buttocks pain not relieved by medication management and other conservative care.  Informed consent was obtained after describing risks and benefits of the procedure with the patient, this includes bleeding, bruising, infection, paralysis and medication side effects. The patient wishes to proceed and has given written consent. The patient was placed in a prone position. The lumbar and sacral area was marked and prepped with Betadine. A 25-gauge 1-1/2 inch needle was inserted into the skin and subcutaneous tissue and 1 mL of 1% lidocaine was injected. Then a 25-gauge 3 inch spinal needle was inserted under fluoroscopic guidance into the left sacroiliac joint. AP and lateral images were utilized. Omnipaque 180x0.5 mL under live fluoroscopy demonstrated no intravascular uptake. Then a solution containing one ML of 40 mg per mL depomedrol and 2 ML of 1% lidocaine MPF was injected x1.5 mL.Procedure was performed on the right side using same needle equipment and technique Patient tolerated the procedure well. Post procedure instructions were given. Please see post procedure form.

## 2012-11-07 NOTE — Progress Notes (Signed)
  [facility]   Name: Shawn Stafford DOB:1940-11-26 MRN: [id number]  Date:11/07/2012  Physician: Alysia Penna, MD    Nurse/CMA: Vonna Drafts, CMA  Allergies: No Known Allergies  Consent Signed: yes  Is patient diabetic? no   Pregnant: no LMP: No LMP for male patient. (age 72-55)  Anticoagulants: no Anti-inflammatory: no Antibiotics: no  Procedure: bilateral sacroilliac injection  Position: Prone Start Time:  3:49 End Time:  3:56 Fluoro Time: 20  RN/CMA Levens,CMA Walker,CMA    Time 3:13pm 4:01pm    BP 124/66 157/50    Pulse 87 81    Respirations 14 14    O2 Sat 96 96    S/S 6 6    Pain Level 6 5     D/C home with [name], patient A & O X 3, D/C instructions reviewed, and sits independently.

## 2012-11-27 ENCOUNTER — Ambulatory Visit: Payer: Worker's Compensation | Admitting: Physical Medicine & Rehabilitation

## 2012-12-05 ENCOUNTER — Ambulatory Visit: Payer: Worker's Compensation | Admitting: Physical Medicine & Rehabilitation

## 2012-12-08 ENCOUNTER — Ambulatory Visit: Payer: Worker's Compensation | Admitting: Physical Medicine & Rehabilitation

## 2012-12-12 ENCOUNTER — Ambulatory Visit (HOSPITAL_BASED_OUTPATIENT_CLINIC_OR_DEPARTMENT_OTHER): Payer: Worker's Compensation | Admitting: Physical Medicine & Rehabilitation

## 2012-12-12 ENCOUNTER — Encounter: Payer: Worker's Compensation | Attending: Physical Medicine & Rehabilitation

## 2012-12-12 ENCOUNTER — Telehealth: Payer: Self-pay | Admitting: *Deleted

## 2012-12-12 ENCOUNTER — Encounter: Payer: Self-pay | Admitting: Physical Medicine & Rehabilitation

## 2012-12-12 VITALS — BP 144/62 | HR 87 | Resp 14 | Ht 69.0 in | Wt 202.6 lb

## 2012-12-12 DIAGNOSIS — S139XXA Sprain of joints and ligaments of unspecified parts of neck, initial encounter: Secondary | ICD-10-CM | POA: Insufficient documentation

## 2012-12-12 DIAGNOSIS — M48061 Spinal stenosis, lumbar region without neurogenic claudication: Secondary | ICD-10-CM | POA: Insufficient documentation

## 2012-12-12 DIAGNOSIS — E785 Hyperlipidemia, unspecified: Secondary | ICD-10-CM | POA: Insufficient documentation

## 2012-12-12 DIAGNOSIS — I10 Essential (primary) hypertension: Secondary | ICD-10-CM | POA: Insufficient documentation

## 2012-12-12 DIAGNOSIS — M47816 Spondylosis without myelopathy or radiculopathy, lumbar region: Secondary | ICD-10-CM

## 2012-12-12 DIAGNOSIS — W1789XA Other fall from one level to another, initial encounter: Secondary | ICD-10-CM | POA: Insufficient documentation

## 2012-12-12 DIAGNOSIS — M47817 Spondylosis without myelopathy or radiculopathy, lumbosacral region: Secondary | ICD-10-CM

## 2012-12-12 DIAGNOSIS — M47812 Spondylosis without myelopathy or radiculopathy, cervical region: Secondary | ICD-10-CM | POA: Insufficient documentation

## 2012-12-12 DIAGNOSIS — M533 Sacrococcygeal disorders, not elsewhere classified: Secondary | ICD-10-CM

## 2012-12-12 MED ORDER — TRAMADOL HCL 50 MG PO TABS: 50.0000 mg | ORAL_TABLET | Freq: Three times a day (TID) | ORAL | Status: DC | PRN

## 2012-12-12 NOTE — Progress Notes (Signed)
Subjective:    Patient ID: Shawn Stafford, male    DOB: 1941/10/03, 72 y.o.   MRN: [id number] 72 year old male who fell backward off the steps of his tractor trailer onto packed dirt on 06/27/2012. He had immediate onset of neck pain and low back pain. He had no radiating symptoms in his upper extremities. He's had pre-existing problems of numbness in both lower extremities particularly the lateral thighs. He had epidural for numbness in his legs in March of 2013 by Dr. Sharlet Salina which was beneficial.  He also complains of pain around the left shoulder blade. It feels like it is underneath the shoulder blade. He underwent cervical thoracic and lumbar MRI. I reviewed both report as well as actual films. The thoracic MRI was normal. The cervical MRI With disc osteophyte complex C4-5 and C5-C6. No significant stenosis.  Lumbar MRI showed disc protrusions at L3-L4 greater than L2-L3 greater than L4-L5 and L5-S1 HPI  Pain Inventory Average Pain 4 Pain Right Now 3 My pain is burning and aching  In the last 24 hours, has pain interfered with the following? General activity 3 Relation with others 2 Enjoyment of life 2 What TIME of day is your pain at its worst? morning and evening Sleep (in general) Fair  Pain is worse with: walking, bending and standing Pain improves with: rest, heat/ice, medication and TENS Relief from Meds: 7  Mobility walk without assistance how many minutes can you walk? 10-15 ability to climb steps?  yes do you drive?  yes transfers alone Do you have any goals in this area?  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  yes  Neuro/Psych weakness  Prior Studies Any changes since last visit?  no  Physicians involved in your care Any changes since last visit?  no   Family History  Problem Relation Age of Onset  . Dementia Mother   . Cancer Father    History   Social  History  . Marital Status: Married    Spouse Name: N/A    Number of Children: N/A  . Years of Education: N/A   Social History Main Topics  . Smoking status: Never Smoker   . Smokeless tobacco: Never Used  . Alcohol Use: No  . Drug Use: No  . Sexually Active: None   Other Topics Concern  . None   Social History Narrative  . None   Past Surgical History  Procedure Date  . Spine surgery   . Hernia repair    Past Medical History  Diagnosis Date  . Hyperlipidemia   . Hypertension   . Prostate disorder   . Depression    BP 144/62  Pulse 87  Resp 14  Ht 5\' 9"  (1.753 m)  Wt 202 lb 9.6 oz (91.899 kg)  BMI 29.92 kg/m2   Review of Systems  Musculoskeletal: Positive for back pain.  Neurological: Positive for weakness.  All other systems reviewed and are negative.       Objective:   Physical Exam   Mild Tenderness over the left PSIS area Range of motion in the lumbar spine is limited and is painful more with extension than with flexion Hip range of motion is full No pain with hip range of motion Straight leg raising test is negative Lower extremity strength is normal General no acute distress      Assessment & Plan:  1.Cervical StrainIn the setting  of cervical spondylosis/Cervical degenerative disc with work related injury resulting from fall onto back. There is no evidence of cervical radiculitis. MRI showing chronic changes Most notably at C4-5 and C5-C6  He is responding to physical therapy.The symptoms have largely resolved 2. Lumbar spinal stenosis. He has chronic complaints that refer to the L3 dermatomal distribution. He has sensory findings at L4 on the right and bilateral L5 and reflex changes right S1. It is difficult to state whether these are old or new. 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 He does had pain that was localized  to the left PSIS area. This responded to bilateral SI joint injection. No further imaging studies recommended   Agree with current medications. We'll take the tramadol 50 mg every 8 hours when necessary  Ibuprofen 600 mg 3 times a day with food   Gabapentin was a medication he has been on chronically for his chronic radicular type symptoms he is to continue that as per his physician's orders  May drive 35 hours a week Anticipate increasing this next visit, Anticipate MMI next visit

## 2012-12-12 NOTE — Patient Instructions (Addendum)
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

## 2012-12-12 NOTE — Telephone Encounter (Signed)
Helene Shoe, Case Manager would like update on patients status

## 2012-12-13 NOTE — Telephone Encounter (Signed)
Called and spoke with Helene Shoe, Case, Manager. She was looking over patients Visit Summary and it states that Dr. Letta Pate will continue to follow patient every 6 months for his Tramadol. Adjuster is wanting to know after patient is released for MMI can the PCP continue to prescribe Tramadol instead of patient being seen in our office every 6 months for life.

## 2012-12-13 NOTE — Telephone Encounter (Signed)
Left detailed message on [facility] Voice Mail.

## 2012-12-13 NOTE — Telephone Encounter (Signed)
I am seeing the pt back in 3 weeks, PCP or his PMR Dr Sharlet Salina can Rx Tramadol instead of me

## 2013-01-02 ENCOUNTER — Ambulatory Visit: Payer: Worker's Compensation | Admitting: Physical Medicine & Rehabilitation

## 2013-01-05 ENCOUNTER — Ambulatory Visit: Payer: Worker's Compensation | Admitting: Physical Medicine & Rehabilitation

## 2013-01-16 ENCOUNTER — Ambulatory Visit (HOSPITAL_BASED_OUTPATIENT_CLINIC_OR_DEPARTMENT_OTHER): Payer: Worker's Compensation | Admitting: Physical Medicine & Rehabilitation

## 2013-01-16 ENCOUNTER — Encounter: Payer: Worker's Compensation | Attending: Physical Medicine & Rehabilitation

## 2013-01-16 ENCOUNTER — Encounter: Payer: Self-pay | Admitting: Physical Medicine & Rehabilitation

## 2013-01-16 VITALS — BP 114/51 | HR 72 | Resp 14 | Ht 69.0 in | Wt 201.4 lb

## 2013-01-16 DIAGNOSIS — M47812 Spondylosis without myelopathy or radiculopathy, cervical region: Secondary | ICD-10-CM | POA: Insufficient documentation

## 2013-01-16 DIAGNOSIS — S161XXD Strain of muscle, fascia and tendon at neck level, subsequent encounter: Secondary | ICD-10-CM

## 2013-01-16 DIAGNOSIS — M47816 Spondylosis without myelopathy or radiculopathy, lumbar region: Secondary | ICD-10-CM

## 2013-01-16 DIAGNOSIS — S139XXA Sprain of joints and ligaments of unspecified parts of neck, initial encounter: Secondary | ICD-10-CM | POA: Insufficient documentation

## 2013-01-16 DIAGNOSIS — I10 Essential (primary) hypertension: Secondary | ICD-10-CM | POA: Insufficient documentation

## 2013-01-16 DIAGNOSIS — M48061 Spinal stenosis, lumbar region without neurogenic claudication: Secondary | ICD-10-CM | POA: Insufficient documentation

## 2013-01-16 DIAGNOSIS — E785 Hyperlipidemia, unspecified: Secondary | ICD-10-CM | POA: Insufficient documentation

## 2013-01-16 DIAGNOSIS — M47817 Spondylosis without myelopathy or radiculopathy, lumbosacral region: Secondary | ICD-10-CM

## 2013-01-16 DIAGNOSIS — Z5189 Encounter for other specified aftercare: Secondary | ICD-10-CM

## 2013-01-16 DIAGNOSIS — W1789XA Other fall from one level to another, initial encounter: Secondary | ICD-10-CM | POA: Insufficient documentation

## 2013-01-16 DIAGNOSIS — IMO0002 Reserved for concepts with insufficient information to code with codable children: Secondary | ICD-10-CM

## 2013-01-16 MED ORDER — TRAMADOL HCL 50 MG PO TABS: 50.0000 mg | ORAL_TABLET | Freq: Three times a day (TID) | ORAL | Status: AC | PRN

## 2013-01-16 NOTE — Progress Notes (Signed)
Subjective:    Patient ID: Shawn Stafford, male    DOB: September 14, 1941, 72 y.o.   MRN: [id number]  HPI 72 year old male who fell backward off the steps of his tractor trailer onto packed dirt on 06/27/2012. He had immediate onset of neck pain and low back pain. He had no radiating symptoms in his upper extremities. He's had pre-existing problems of numbness in both lower extremities particularly the lateral thighs. He had epidural for numbness in his legs in March of 2013 by Dr. Sharlet Salina which was beneficial.  He also complains of pain around the left shoulder blade. It feels like it is underneath the shoulder blade. He underwent cervical thoracic and lumbar MRI. I reviewed both report as well as actual films. The thoracic MRI was normal. The cervical MRI With disc osteophyte complex C4-5 and C5-C6. No significant stenosis.   Lumbar MRI showed disc protrusions at L3-L4 greater than L2-L3 greater than L4-L5 and L5-S1  Pain Inventory Average Pain 4 Pain Right Now 3 My pain is sharp and burning  In the last 24 hours, has pain interfered with the following? General activity 2 Relation with others 1 Enjoyment of life 1 What TIME of day is your pain at its worst? evening Sleep (in general) Fair  Pain is worse with: sitting and standing Pain improves with: rest, therapy/exercise, medication and TENS Relief from Meds: 7  Mobility walk without assistance  Function employed # of hrs/week 35  Neuro/Psych trouble walking  Prior Studies Any changes since last visit?  no  Physicians involved in your care Any changes since last visit?  no   Family History  Problem Relation Age of Onset  . Dementia Mother   . Cancer Father    History   Social History  . Marital Status: Married    Spouse Name: N/A    Number of Children: N/A  . Years of Education: N/A   Social History Main Topics  . Smoking status: Never Smoker   . Smokeless tobacco: Never Used  . Alcohol Use: No  . Drug Use: No  .  Sexually Active: None   Other Topics Concern  . None   Social History Narrative  . None   Past Surgical History  Procedure Laterality Date  . Spine surgery    . Hernia repair     Past Medical History  Diagnosis Date  . Hyperlipidemia   . Hypertension   . Prostate disorder   . Depression    BP 114/51  Pulse 72  Resp 14  Ht 5\' 9"  (1.753 m)  Wt 201 lb 6.4 oz (91.354 kg)  BMI 29.73 kg/m2  SpO2 96%    Review of Systems  HENT: Positive for neck pain.   Musculoskeletal: Positive for back pain.  All other systems reviewed and are negative.       Objective:   Physical Exam  Mild Tenderness over the left PSIS area  Range of motion in the lumbar spine is limited and is painful more with extension than with flexion  Hip range of motion is full  No pain with hip range of motion  Straight leg raising test is negative  Lower extremity strength is normal  General no acute distress       Assessment & Plan:  1.Cervical StrainIn the setting of cervical spondylosis/Cervical degenerative disc with work related injury resulting from fall onto back. There is no evidence of cervical radiculitis. MRI showing chronic changes Most notably at C4-5 and C5-C6  He is  responding to physical therapy.The symptoms have largely resolved  2. Lumbar spinal stenosis. He has chronic complaints that refer to the L3 dermatomal distribution. He has sensory findings at L4 on the right and bilateral L5 and reflex changes right S1. It is difficult to state whether these are old or new.  3.Lumbar spondylosis which responded to lumbar medial branch blocks L3-L4 as well as L5 dorsal ramus injection 50% reduction of pain  4. 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 He does had pain that was localized to the left PSIS area. This responded to bilateral SI joint injection. No further imaging studies recommended  Agree with current medications.  Ibuprofen 400mg  Tramadol 50mg  BID May work 40 hrs/week 2% PPD RTC prn

## 2013-01-16 NOTE — Patient Instructions (Addendum)
Ibuprofen 400mg  Tramadol 50mg  BID May work 40 hrs/week 2% PPD RTC prn

## 2013-02-12 ENCOUNTER — Emergency Department: Payer: Self-pay | Admitting: Emergency Medicine

## 2013-02-12 LAB — CBC
HCT: 40.9 % (ref 40.0–52.0)
HGB: 14.2 g/dL (ref 13.0–18.0)
MCH: 32.5 pg (ref 26.0–34.0)
MCHC: 34.6 g/dL (ref 32.0–36.0)
MCV: 94 fL (ref 80–100)
Platelet: 204 10*3/uL (ref 150–440)
RBC: 4.36 10*6/uL — ABNORMAL LOW (ref 4.40–5.90)
WBC: 10.7 10*3/uL — ABNORMAL HIGH (ref 3.8–10.6)

## 2013-02-12 LAB — PROTIME-INR: Prothrombin Time: 14 secs (ref 11.5–14.7)

## 2013-02-12 LAB — TROPONIN I: Troponin-I: <0.02 ng/mL

## 2013-02-12 LAB — COMPREHENSIVE METABOLIC PANEL
Albumin: 3.8 g/dL (ref 3.4–5.0)
Alkaline Phosphatase: 50 U/L (ref 50–136)
Bilirubin,Total: 0.4 mg/dL (ref 0.2–1.0)
Calcium, Total: 9 mg/dL (ref 8.5–10.1)
Chloride: 108 mmol/L — ABNORMAL HIGH (ref 98–107)
Co2: 26 mmol/L (ref 21–32)
Osmolality: 284 (ref 275–301)
Potassium: 4.6 mmol/L (ref 3.5–5.1)
SGOT(AST): 29 U/L (ref 15–37)
Sodium: 139 mmol/L (ref 136–145)
Total Protein: 7 g/dL (ref 6.4–8.2)

## 2013-02-15 ENCOUNTER — Telehealth: Payer: Self-pay

## 2013-02-15 NOTE — Telephone Encounter (Signed)
[facility] Gu[facility] patients case manager needs office note from 2/25 faxed to her at [phone]

## 2013-02-15 NOTE — Telephone Encounter (Signed)
Office note faxed to [place].

## 2013-12-17 ENCOUNTER — Telehealth: Payer: Self-pay

## 2013-12-17 NOTE — Telephone Encounter (Signed)
Will need visit with PA, opioid agreement, Tramadol 50mg  po BID

## 2013-12-17 NOTE — Telephone Encounter (Signed)
Pharmacy request for Tramadol was sent over. Patient has not been seen since 12/2012. Is this okay to refill?

## 2013-12-20 NOTE — Telephone Encounter (Signed)
Contacted patient to inform him that he would need a OV because he has not been seen since 12/2012. Patient states this is a WC case and he will have to get the appt. Approved through them. Patient will call back to make the appt. Patient also said he was going to go to another MD for a second opinion which WC has already approved. Tramadol was not refilled.

## 2014-02-11 ENCOUNTER — Ambulatory Visit: Payer: Self-pay | Admitting: Orthopedic Surgery

## 2014-02-11 LAB — CBC
HCT: 42.6 % (ref 40.0–52.0)
HGB: 14.6 g/dL (ref 13.0–18.0)
MCH: 31.9 pg (ref 26.0–34.0)
MCHC: 34.3 g/dL (ref 32.0–36.0)
MCV: 93 fL (ref 80–100)
Platelet: 194 10*3/uL (ref 150–440)
RBC: 4.57 10*6/uL (ref 4.40–5.90)
RDW: 12.9 % (ref 11.5–14.5)
WBC: 5.8 10*3/uL (ref 3.8–10.6)

## 2014-02-11 LAB — BASIC METABOLIC PANEL
ANION GAP: 6 — AB (ref 7–16)
BUN: 22 mg/dL — ABNORMAL HIGH (ref 7–18)
CREATININE: 1.67 mg/dL — AB (ref 0.60–1.30)
Calcium, Total: 9.1 mg/dL (ref 8.5–10.1)
Chloride: 107 mmol/L (ref 98–107)
Co2: 25 mmol/L (ref 21–32)
EGFR (African American): 46 — ABNORMAL LOW
EGFR (Non-African Amer.): 40 — ABNORMAL LOW
GLUCOSE: 93 mg/dL (ref 65–99)
Osmolality: 279 (ref 275–301)
Potassium: 4.4 mmol/L (ref 3.5–5.1)
SODIUM: 138 mmol/L (ref 136–145)

## 2014-02-11 LAB — APTT: ACTIVATED PTT: 31.8 s (ref 23.6–35.9)

## 2014-02-11 LAB — PROTIME-INR
INR: 1.1
Prothrombin Time: 14.1 secs (ref 11.5–14.7)

## 2014-02-14 ENCOUNTER — Ambulatory Visit: Payer: Self-pay | Admitting: Orthopedic Surgery

## 2015-03-15 NOTE — Op Note (Signed)
PATIENT NAME:  Shawn Stafford, SIPP MR#:  [id number] DATE OF BIRTH:  09/11/1941  DATE OF PROCEDURE:  02/14/2014  PREOPERATIVE DIAGNOSES: Right shoulder rotator cuff tear, acromioclavicular joint arthrosis, subacromial impingement and possible biceps tendon tear.   POSTOPERATIVE DIAGNOSIS: Right shoulder rotator cuff tear involving the supraspinatus, a high-grade partial tear thickness, acromioclavicular joint arthrosis, subacromial impingement (chronic) and superior labral tear with biceps tendinosis.   SURGERY: Right shoulder arthroscopic subacromial decompression, distal clavicle excision and biceps tenotomy with mini-open rotator cuff repair.   ANESTHESIA: General with interscalene block and local anesthetic with 1% lidocaine plain and 0.25% Marcaine plain.   SURGEON: Thornton Park, M.D.   ESTIMATED BLOOD LOSS: Minimal.   COMPLICATIONS: None.   IMPLANTS: ArthroCare Opus Magnum-2 anchors x 2 and Magnum-M anchors x 2.   INDICATIONS FOR PROCEDURE: Mr. Calame is a 74 year old male, who has had persistent pain in his right shoulder that has not responded to nonoperative management including nonsteroidal anti-inflammatories, subacromial injection as well as physical therapy and rest and activity modification. Given his failure to respond to conservative management, it was recommend that the patient proceed with repair of the rotator cuff tear as documented by MRI.   PROCEDURE NOTE: The patient was met in the preoperative area. The right shoulder was marked with the word "yes" according to the hospital's right side protocol. His history and physical was updated. He was then brought to the operating room where he underwent placement of an interscalene block and then general endotracheal intubation. He was placed in a beach chair position. All bony prominences were adequately padded. The patient was then prepped and draped in a sterile fashion.   A timeout was performed to verify the patient's name,  date of birth, medical record number, correct site of surgery and correct procedure to be performed. It was also used to verify the patient had received antibiotics and that all appropriate instruments, implants, and radiographic studies were available in the room. Once all in attendance were in agreement, the case began.   The examination under anesthesia revealed full passive range of motion and no instability to load and shift testing and he had negative sulcus sign.   The bony landmarks were drawn out with a surgical marker along with proposed arthroscopy incisions. These were pre-injected with 1% lidocaine plain. An 11 blade was used to create a posterior portal. The arthroscope was placed into the glenohumeral joint through this portal. Under direct visualization using an 18-gauge spinal needle, the patient had anterior portal created as well. A 5.75 mm arthroscopic cannula was placed through the anterior portal for assistance with diagnostic portion of the exam. The patient had a full glenohumeral joint evaluation. Findings on arthroscopy included a high-grade partial-thickness tear involving the supraspinatus. There was significant thickening of the biceps tendon consistent with tendinosis. There was also a superior labral tear with superior labral fraying. This extended into the posterior-superior labrum was as well. The anterior-inferior labrum was intact. The subscapularis had mild fraying of the superior fibers, but there was no full-thickness tear. There is no evidence of loose bodies or HAGL lesion in the inferior recess. The patient did have degenerative changes diffusely involving the glenoid and humeral head without full-thickness cartilage loss.   A biceps tenolysis was performed arthroscopically using an arthroscopic scissor and 90 degree ArthroCare wands. An 18-gauge spinal needle was then used to mark the partial-thickness tear of the supraspinatus for identification on the bursal side. A  4.0 resector shaver blade was used through  the anterior portal to debride the superior labral tear as well.   The arthroscope was then placed into the subacromial space. A lateral portal was created again using an 18-gauge spinal needle for localization. An extensive bursectomy was performed using a 4.0 resector shaver blade and ArthroCare wand. The location of the tear was easily identified with the 0 PDS from the bursal side. The rotator cuff tear was completed using a 4.0 resector shaver blade. A 5.5 mm resector shaver blade was then used through the lateral portal to perform a subacromial decompression. It was then placed through the anterior portal after the cannula was removed to perform a distal clavicle excision. The subacromial space was then cleared of all bony debris. Two Opus Smart stitches were then placed on the lateral margin of the rotator cuff. Final arthroscopic images were taken.   All arthroscopic instruments were then removed. A saber-type incision was made along the lateral border of the acromion. The subcutaneous tissue was dissected using a Metzenbaum scissor and pick-up along with electrocautery. The deltoid fascia was identified. The deltoid was split in line with its fibers. The Smart stitches were brought out through the deltoid split. A self-retaining retractor was placed to allow for visualization of the rotator cuff tear. A 5.5 mm resector shaver blade was then used to remove all remaining debris of the supraspinatus from the greater tuberosity. Punctate bleeding was identified. However, the greater tuberosity was not decorticated.   Two Magnum-M anchors were placed at the articular margin with the humeral head. These were passed through the medial aspect of the rotator cuff using a first-pass suture passer. These were clamped for later repair. Then, two Magnum-2 anchors were placed lateral to the greater tuberosity and then tensioned to allow for excellent approximation of the  lateral aspect of the rotator cuff. Once the cuff had been reduced to its original position, the sutures of the Magnum-M anchors were tied down manually for a double row repair. The arm was then rotated and the tear moved as a single unit. There were no loose edges or "dog ears." The wound was copiously irrigated. Final images of the rotator cuff repair were taken both externally and arthroscopically from the glenohumeral joint. After the wounds were copiously irrigated, the deltoid fascia was closed with 0 Vicryl, the subcutaneous tissue was closed with 2-0 Vicryl and the skin approximated with a running 3-0 clear Monocryl. The 3 arthroscopy portals were closed with 4-0 nylon. Steri-Strips were applied along with a dry sterile dressing, TENS unit leads and a Polar Care sleeve. The patient was placed in an abduction sling. He was awoken and brought to the PACU in stable condition. I was scrubbed and present for the entire case. All sharp and instrument counts were correct at the conclusion of the case. I spoke with the patient's family member postoperatively and let them know that the patient was stable in the recovery room and the case had gone without complication.   ____________________________ Timoteo Gaul, MD [id number] D: 02/19/2014 11:22:56 ET T: 02/19/2014 11:42:28 ET JOB#: [id number]  cc: Timoteo Gaul, MD, <Dictator> Timoteo Gaul MD ELECTRONICALLY SIGNED 02/22/2014 20:05

## 2015-12-31 DIAGNOSIS — Z0000 Encounter for general adult medical examination without abnormal findings: Secondary | ICD-10-CM | POA: Diagnosis not present

## 2015-12-31 DIAGNOSIS — E782 Mixed hyperlipidemia: Secondary | ICD-10-CM | POA: Diagnosis not present

## 2016-01-06 DIAGNOSIS — H401131 Primary open-angle glaucoma, bilateral, mild stage: Secondary | ICD-10-CM | POA: Diagnosis not present

## 2016-01-07 DIAGNOSIS — N183 Chronic kidney disease, stage 3 (moderate): Secondary | ICD-10-CM | POA: Diagnosis not present

## 2016-01-07 DIAGNOSIS — I10 Essential (primary) hypertension: Secondary | ICD-10-CM | POA: Diagnosis not present

## 2016-01-07 DIAGNOSIS — E782 Mixed hyperlipidemia: Secondary | ICD-10-CM | POA: Diagnosis not present

## 2016-01-23 DIAGNOSIS — L300 Nummular dermatitis: Secondary | ICD-10-CM | POA: Diagnosis not present

## 2016-01-23 DIAGNOSIS — L821 Other seborrheic keratosis: Secondary | ICD-10-CM | POA: Diagnosis not present

## 2016-03-18 DIAGNOSIS — B86 Scabies: Secondary | ICD-10-CM | POA: Diagnosis not present

## 2016-03-28 ENCOUNTER — Inpatient Hospital Stay
Admission: EM | Admit: 2016-03-28 | Discharge: 2016-03-31 | DRG: 440 | Disposition: A | Discharge status: Home / Self Care | Payer: PPO | Attending: Internal Medicine | Admitting: Internal Medicine

## 2016-03-28 ENCOUNTER — Emergency Department: Payer: PPO

## 2016-03-28 ENCOUNTER — Encounter: Payer: Self-pay | Admitting: *Deleted

## 2016-03-28 ENCOUNTER — Inpatient Hospital Stay: Payer: PPO

## 2016-03-28 DIAGNOSIS — R1013 Epigastric pain: Secondary | ICD-10-CM | POA: Diagnosis not present

## 2016-03-28 DIAGNOSIS — I129 Hypertensive chronic kidney disease with stage 1 through stage 4 chronic kidney disease, or unspecified chronic kidney disease: Secondary | ICD-10-CM | POA: Diagnosis present

## 2016-03-28 DIAGNOSIS — K859 Acute pancreatitis without necrosis or infection, unspecified: Principal | ICD-10-CM | POA: Diagnosis present

## 2016-03-28 DIAGNOSIS — E6609 Other obesity due to excess calories: Secondary | ICD-10-CM | POA: Diagnosis present

## 2016-03-28 DIAGNOSIS — E785 Hyperlipidemia, unspecified: Secondary | ICD-10-CM | POA: Diagnosis not present

## 2016-03-28 DIAGNOSIS — R000 Tachycardia, unspecified: Secondary | ICD-10-CM | POA: Diagnosis not present

## 2016-03-28 DIAGNOSIS — I10 Essential (primary) hypertension: Secondary | ICD-10-CM | POA: Diagnosis not present

## 2016-03-28 DIAGNOSIS — N400 Enlarged prostate without lower urinary tract symptoms: Secondary | ICD-10-CM | POA: Diagnosis not present

## 2016-03-28 DIAGNOSIS — K760 Fatty (change of) liver, not elsewhere classified: Secondary | ICD-10-CM | POA: Diagnosis not present

## 2016-03-28 DIAGNOSIS — R1010 Upper abdominal pain, unspecified: Secondary | ICD-10-CM | POA: Diagnosis not present

## 2016-03-28 DIAGNOSIS — Z79899 Other long term (current) drug therapy: Secondary | ICD-10-CM

## 2016-03-28 DIAGNOSIS — N183 Chronic kidney disease, stage 3 (moderate): Secondary | ICD-10-CM | POA: Diagnosis present

## 2016-03-28 DIAGNOSIS — N189 Chronic kidney disease, unspecified: Secondary | ICD-10-CM | POA: Diagnosis not present

## 2016-03-28 DIAGNOSIS — Z6827 Body mass index (BMI) 27.0-27.9, adult: Secondary | ICD-10-CM

## 2016-03-28 HISTORY — DX: Reserved for concepts with insufficient information to code with codable children: IMO0002

## 2016-03-28 HISTORY — DX: Chronic kidney disease, unspecified: N18.9

## 2016-03-28 LAB — CBC
HCT: 42.4 % (ref 40.0–52.0)
Hemoglobin: 14.6 g/dL (ref 13.0–18.0)
MCH: 31.7 pg (ref 26.0–34.0)
MCHC: 34.5 g/dL (ref 32.0–36.0)
MCV: 91.9 fL (ref 80.0–100.0)
PLATELETS: 203 10*3/uL (ref 150–440)
RBC: 4.62 MIL/uL (ref 4.40–5.90)
RDW: 13 % (ref 11.5–14.5)
WBC: 13.7 10*3/uL — AB (ref 3.8–10.6)

## 2016-03-28 LAB — LIPID PANEL
Cholesterol: 143 mg/dL (ref 0–200)
HDL: 30 mg/dL — ABNORMAL LOW (ref >40)
LDL CALC: 88 mg/dL (ref 0–99)
TRIGLYCERIDES: 124 mg/dL (ref <150)
Total CHOL/HDL Ratio: 4.8 RATIO
VLDL: 25 mg/dL (ref 0–40)

## 2016-03-28 LAB — URINALYSIS COMPLETE WITH MICROSCOPIC (ARMC ONLY)
BILIRUBIN URINE: NEGATIVE
Bacteria, UA: NONE SEEN
Glucose, UA: 150 mg/dL — AB
Hgb urine dipstick: NEGATIVE
LEUKOCYTES UA: NEGATIVE
NITRITE: NEGATIVE
PH: 7 (ref 5.0–8.0)
Protein, ur: 100 mg/dL — AB
RBC / HPF: NONE SEEN RBC/hpf (ref 0–5)
Specific Gravity, Urine: 1.015 (ref 1.005–1.030)
Squamous Epithelial / LPF: NONE SEEN
WBC, UA: NONE SEEN WBC/hpf (ref 0–5)

## 2016-03-28 LAB — COMPREHENSIVE METABOLIC PANEL
ALK PHOS: 46 U/L (ref 38–126)
ALT: 32 U/L (ref 17–63)
AST: 32 U/L (ref 15–41)
Albumin: 4.5 g/dL (ref 3.5–5.0)
Anion gap: 9 (ref 5–15)
BILIRUBIN TOTAL: 1 mg/dL (ref 0.3–1.2)
BUN: 20 mg/dL (ref 6–20)
CALCIUM: 9.1 mg/dL (ref 8.9–10.3)
CO2: 23 mmol/L (ref 22–32)
CREATININE: 1.41 mg/dL — AB (ref 0.61–1.24)
Chloride: 107 mmol/L (ref 101–111)
GFR, EST AFRICAN AMERICAN: 54 mL/min — AB (ref >60)
GFR, EST NON AFRICAN AMERICAN: 47 mL/min — AB (ref >60)
Glucose, Bld: 165 mg/dL — ABNORMAL HIGH (ref 65–99)
Potassium: 4.2 mmol/L (ref 3.5–5.1)
Sodium: 139 mmol/L (ref 135–145)
TOTAL PROTEIN: 7.1 g/dL (ref 6.5–8.1)

## 2016-03-28 LAB — TROPONIN I: Troponin I: <0.03 ng/mL (ref <0.031)

## 2016-03-28 LAB — LIPASE, BLOOD: Lipase: 7177 U/L — ABNORMAL HIGH (ref 11–51)

## 2016-03-28 MED ORDER — ADULT MULTIVITAMIN W/MINERALS CH
1.0000 | ORAL_TABLET | Freq: Every day | ORAL | Status: DC
  Administered 2016-03-30 – 2016-03-31 (×2): 1 via ORAL
  Filled 2016-03-28 (×3): qty 1

## 2016-03-28 MED ORDER — MORPHINE SULFATE (PF) 2 MG/ML IV SOLN
2.0000 mg | Freq: Once | INTRAVENOUS | Status: DC
  Filled 2016-03-28: qty 1

## 2016-03-28 MED ORDER — MORPHINE SULFATE (PF) 4 MG/ML IV SOLN
4.0000 mg | Freq: Once | INTRAVENOUS | Status: AC
  Administered 2016-03-28: 4 mg via INTRAVENOUS
  Filled 2016-03-28: qty 1

## 2016-03-28 MED ORDER — HYDROCODONE-ACETAMINOPHEN 5-325 MG PO TABS
1.0000 | ORAL_TABLET | ORAL | Status: DC | PRN
  Administered 2016-03-28 (×2): 2 via ORAL
  Administered 2016-03-29: 1 via ORAL
  Administered 2016-03-29 – 2016-03-31 (×7): 2 via ORAL
  Filled 2016-03-28 (×5): qty 2
  Filled 2016-03-28: qty 1
  Filled 2016-03-28 (×4): qty 2

## 2016-03-28 MED ORDER — TAMSULOSIN HCL 0.4 MG PO CAPS
0.4000 mg | ORAL_CAPSULE | Freq: Every day | ORAL | Status: DC
Start: 2016-03-28 — End: 2016-03-31
  Administered 2016-03-28 – 2016-03-31 (×4): 0.4 mg via ORAL
  Filled 2016-03-28 (×4): qty 1

## 2016-03-28 MED ORDER — SODIUM CHLORIDE 0.9 % IV BOLUS (SEPSIS)
500.0000 mL | INTRAVENOUS | Status: AC
  Administered 2016-03-28: 500 mL via INTRAVENOUS

## 2016-03-28 MED ORDER — OMEGA-3 FATTY ACIDS 1000 MG PO CAPS: 2.0000 g | ORAL_CAPSULE | Freq: Every day | ORAL | Status: DC

## 2016-03-28 MED ORDER — ENOXAPARIN SODIUM 40 MG/0.4ML ~~LOC~~ SOLN
40.0000 mg | SUBCUTANEOUS | Status: DC
  Administered 2016-03-28 – 2016-03-30 (×3): 40 mg via SUBCUTANEOUS
  Filled 2016-03-28 (×3): qty 0.4

## 2016-03-28 MED ORDER — ONDANSETRON HCL 4 MG PO TABS: 4.0000 mg | ORAL_TABLET | Freq: Four times a day (QID) | ORAL | Status: DC | PRN

## 2016-03-28 MED ORDER — SODIUM CHLORIDE 0.9 % IV SOLN
INTRAVENOUS | Status: DC
  Administered 2016-03-28 – 2016-03-30 (×4): via INTRAVENOUS

## 2016-03-28 MED ORDER — VITAMIN B-12 1000 MCG PO TABS
1000.0000 ug | ORAL_TABLET | Freq: Every day | ORAL | Status: DC
  Administered 2016-03-30 – 2016-03-31 (×2): 1000 ug via ORAL
  Filled 2016-03-28 (×3): qty 1

## 2016-03-28 MED ORDER — DIATRIZOATE MEGLUMINE & SODIUM 66-10 % PO SOLN
15.0000 mL | ORAL | Status: AC
  Administered 2016-03-28: 15 mL via ORAL

## 2016-03-28 MED ORDER — OMEGA-3-ACID ETHYL ESTERS 1 G PO CAPS
2.0000 g | ORAL_CAPSULE | Freq: Every day | ORAL | Status: DC
Start: 2016-03-28 — End: 2016-03-31
  Administered 2016-03-30 – 2016-03-31 (×2): 2 g via ORAL
  Filled 2016-03-28 (×3): qty 2

## 2016-03-28 MED ORDER — ACETAMINOPHEN 650 MG RE SUPP: 650.0000 mg | Freq: Four times a day (QID) | RECTAL | Status: DC | PRN

## 2016-03-28 MED ORDER — ONDANSETRON HCL 4 MG/2ML IJ SOLN
4.0000 mg | Freq: Once | INTRAMUSCULAR | Status: AC
Start: 2016-03-28 — End: 2016-03-28
  Administered 2016-03-28: 4 mg via INTRAVENOUS
  Filled 2016-03-28: qty 2

## 2016-03-28 MED ORDER — HYDROMORPHONE HCL 1 MG/ML IJ SOLN
2.0000 mg | INTRAMUSCULAR | Status: DC | PRN
  Administered 2016-03-28 – 2016-03-29 (×4): 2 mg via INTRAVENOUS
  Filled 2016-03-28 (×4): qty 2

## 2016-03-28 MED ORDER — ACETAMINOPHEN 325 MG PO TABS: 650.0000 mg | ORAL_TABLET | Freq: Four times a day (QID) | ORAL | Status: DC | PRN

## 2016-03-28 MED ORDER — SODIUM CHLORIDE 0.9% FLUSH
3.0000 mL | Freq: Two times a day (BID) | INTRAVENOUS | Status: DC
  Administered 2016-03-29 – 2016-03-31 (×3): 3 mL via INTRAVENOUS

## 2016-03-28 MED ORDER — SIMVASTATIN 20 MG PO TABS
20.0000 mg | ORAL_TABLET | Freq: Every evening | ORAL | Status: DC
  Administered 2016-03-28 – 2016-03-30 (×3): 20 mg via ORAL
  Filled 2016-03-28 (×3): qty 1

## 2016-03-28 MED ORDER — ONDANSETRON HCL 4 MG/2ML IJ SOLN
4.0000 mg | Freq: Four times a day (QID) | INTRAMUSCULAR | Status: DC | PRN
  Administered 2016-03-28 – 2016-03-29 (×2): 4 mg via INTRAVENOUS
  Filled 2016-03-28 (×2): qty 2

## 2016-03-28 NOTE — ED Notes (Signed)
Pt reports having onset of "gastric reflux" pain this morning that he treated with alka seltzers without relief. Pt reports currently having upper right abd and right sided back pain. Pt reports having one episode of vomiting of yellow bile. Pt denies nausea at this morning. Pt reports walking increases the pain.

## 2016-03-28 NOTE — ED Provider Notes (Signed)
[facility] Emergency Department Provider Note  ____________________________________________  Time seen: Approximately 11:55 AM  I have reviewed the triage vital signs and the nursing notes.   HISTORY  Chief Complaint Abdominal Pain and Back Pain    HPI Shawn Stafford is a 75 y.o. male with a past medical history that includes hypertension, hyperlipidemia, and unspecified prostate disorder but no significant cardiac history and no history of aortic abnormalities.  He presents by private vehicle today for evaluation of acute onset epigastric pain that started this morning.  He states he has been in the normal state of health over the last couple days and felt fine when he went to bed last night.  After he awoke this morning and had a banana for breakfast, he acutely developed mild to moderate burning in his epigastrium which he attributed to gas.  He took some acid reflux medication but it did not help.  He went to church and the symptoms got gradually worse over time.  Subsequently he went to the bathroom and he has vomited twice, each time producing only yellow bile.  The nausea has since resolved but the abdominal pain persists and it is severe.  He describes it as sharp and stabbing as well as burning and occasionally radiates to his back.  He has had no numbness or tingling in his extremities and denies chest pain and shortness of breath.  The pain is worse in his upper abdomen in the middle.  He has never had any abdominal surgeries including never having his gallbladder removed.He denies dysuria and hematuria.   Past Medical History  Diagnosis Date  . Hyperlipidemia   . Hypertension   . Prostate disorder   . Depression     Patient Active Problem List   Diagnosis Date Noted  . Lumbar spondylosis 09/21/2012  . Cervical spondylosis 08/07/2012  . Cervical strain 08/07/2012  . 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 Spine surgery    . Hernia repair      Current Outpatient Rx  Name  Route  Sig  Dispense  Refill  . fish oil-omega-3 fatty acids 1000 MG capsule   Oral   Take 2 g by mouth daily.         Marland Kitchen ibuprofen (ADVIL,MOTRIN) 600 MG tablet   Oral   Take 1 tablet (600 mg total) by mouth every 8 (eight) hours as needed.   90 tablet   1   . lisinopril (PRINIVIL,ZESTRIL) 10 MG tablet   Oral   Take 10 mg by mouth daily.         . Multiple Vitamin (MULTIVITAMIN) tablet   Oral   Take 1 tablet by mouth daily.         . simvastatin (ZOCOR) 20 MG tablet   Oral   Take 20 mg by mouth every evening.         . Tamsulosin HCl (FLOMAX) 0.4 MG CAPS   Oral   Take 0.4 mg by mouth daily.         . traMADol (ULTRAM) 50 MG tablet   Oral   Take 1 tablet (50 mg total) by mouth every 8 (eight) hours as needed for pain.   90 tablet   5   . vitamin B-12 (CYANOCOBALAMIN) 1000 MCG tablet   Oral   Take 1,000 mcg by mouth daily.  Allergies Review of patient's allergies indicates no known allergies.  Family History  Problem Relation Age of Onset  . Dementia Mother   . Cancer Father     Social History Social History  Substance Use Topics  . Smoking status: Never Smoker   . Smokeless tobacco: Never Used  . Alcohol Use: No    Review of Systems Constitutional: No fever/chills Eyes: No visual changes. ENT: No sore throat. Cardiovascular: Denies chest pain. Respiratory: Denies shortness of breath. Gastrointestinal: +abd pain worse upper than lower, N/V (emesis x 2), no diarrhea Genitourinary: Negative for dysuria. Musculoskeletal: Negative for back pain. Skin: Negative for rash. Neurological: Negative for headaches, focal weakness or numbness.  10-point ROS otherwise negative.  ____________________________________________   PHYSICAL EXAM:  VITAL SIGNS: ED Triage Vitals  Enc Vitals Group     BP 03/28/16 1142 138/83  mmHg     Pulse Rate 03/28/16 1142 70     Resp 03/28/16 1142 20     Temp 03/28/16 1142 97.8 F (36.6 C)     Temp Source 03/28/16 1142 Oral     SpO2 03/28/16 1142 99 %     Weight 03/28/16 1142 186 lb (84.369 kg)     Height 03/28/16 1142 5\' 9"  (1.753 m)     Head Cir --      Peak Flow --      Pain Score 03/28/16 1134 9     Pain Loc --      Pain Edu? --      Excl. in [place]? --     Constitutional: Alert and oriented. Well appearing and in no acute distress. Eyes: Conjunctivae are normal. PERRL. EOMI. Head: Atraumatic. Nose: No congestion/rhinnorhea. Mouth/Throat: Mucous membranes are moist.  Oropharynx non-erythematous. Neck: No stridor.  No meningeal signs.   Cardiovascular: Normal rate, regular rhythm. Good peripheral circulation. Grossly normal heart sounds.   Respiratory: Normal respiratory effort.  No retractions. Lungs CTAB. Gastrointestinal: Soft With severe tenderness to palpation of the epigastrium and right upper quadrant with positive Murphy sign.  However he is also severely tender in the lower abdomen on both sides although slightly less so than the upper.  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  No right lower quadrant tenderness to palpation. Musculoskeletal: No lower extremity tenderness nor edema. No gross deformities of extremities. Neurologic:  Normal speech and language. No gross focal neurologic deficits are appreciated.  Skin:  Skin is warm, dry and intact. No rash noted. Psychiatric: Mood and affect are normal. Speech and behavior are normal.  ____________________________________________   LABS (all labs ordered are listed, but only abnormal results are displayed)  Labs Reviewed  LIPASE, BLOOD - Abnormal; Notable for the following:    Lipase 7177 (*)    All other components within normal limits  COMPREHENSIVE METABOLIC PANEL - Abnormal; Notable for the following:    Glucose, Bld 165 (*)    Creatinine, Ser  1.41 (*)    GFR calc non Af Amer 47 (*)    GFR calc Af Amer 54 (*)    All other components within normal limits  CBC - Abnormal; Notable for the following:    WBC 13.7 (*)    All other components within normal limits  TROPONIN I  URINALYSIS COMPLETEWITH MICROSCOPIC ([facility] ONLY)   ____________________________________________  EKG  ED ECG REPORT I, Rikita Grabert, the attending physician, personally viewed and interpreted this ECG.  Date: 03/28/2016 EKG Time: 12:10 Rate:  66 Rhythm: normal sinus rhythm QRS Axis: normal Intervals: normal ST/T Wave abnormalities: normal Conduction Disturbances: none Narrative Interpretation: unremarkable  ____________________________________________  RADIOLOGY   US Abdomen Complete  03/28/2016  CLINICAL DATA:  Right upper quadrant pain. EXAM: ABDOMEN ULTRASOUND COMPLETE COMPARISON:  None. FINDINGS: Gallbladder: No gallstones or wall thickening visualized. No sonographic Murphy sign noted by sonographer. Common bile duct: Diameter: 2.4 mm Liver: Increased echogenicity, likely hepatic steatosis. IVC: No abnormality visualized. Pancreas: Visualized portion unremarkable. Spleen: Size and appearance within normal limits. Right Kidney: Length: 10.3 cm. Linear hyperechogenicity, possibly a nonobstructing stone. Left Kidney: Length: 11.9 cm. Echogenicity within normal limits. No mass or hydronephrosis visualized. Abdominal aorta: No aneurysm visualized. Other findings: None. IMPRESSION: 1. Hepatic steatosis. 2. Possible nonobstructing stone in the right kidney. Electronically Signed   By: Dorise Bullion III M.D   On: 03/28/2016 13:48    ____________________________________________   PROCEDURES  Procedure(s) performed: None  Critical Care performed: No ____________________________________________   INITIAL IMPRESSION / ASSESSMENT AND PLAN / ED COURSE  Pertinent labs & imaging results that were available during my care of the patient were reviewed by  me and considered in my medical decision making (see chart for details).  12:17 PM:  My initial impression is that gallbladder disease is most likely although certainly for this demographic group I must consider cardiac and aorta high on the differential as well.  His vital signs are reassuring and although he is clearly uncomfortable he is in no acute distress at this time.  I will evaluate him with stat right upper quadrant and aortic ultrasounds.  If more imaging is required (if gallbladder is unremarkable or if the aorta is abnormal), I will proceed with CT imaging, either angio or w/ venous contrast.  Discussed w/ patient and wife who understand and agree with plan.  +morphine and zofran, small fluid bolus.  ----------------------------------------- 2:03 PM on 03/28/2016 -----------------------------------------  Pain has not improved, will give another dose of morphine.  The patient's workup is notable for a very elevated lipase but a normal and reassuring at upper quadrant ultrasound.  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  However, he did state that he recently completed a course of medication prescribed by his dermatologist to treat scabies.  He just finished the course of treatment yesterday.  I asked him and his wife to figure out what this medication was that they could tell the hospitalist and I did explain this to the hospitalist as well.  ____________________________________________  FINAL CLINICAL IMPRESSION(S) / ED DIAGNOSES  Final diagnoses:  Acute pancreatitis, unspecified complication status, unspecified pancreatitis type     MEDICATIONS GIVEN DURING THIS VISIT:  Medications  morphine 4 MG/ML injection 4 mg (not administered)  morphine 4 MG/ML injection 4 mg (4 mg Intravenous Given 03/28/16 1329)  ondansetron (ZOFRAN) injection 4 mg (4 mg Intravenous Given 03/28/16 1328)  sodium chloride 0.9 % bolus 500 mL (500 mLs Intravenous New Bag/Given 03/28/16 1328)     NEW  OUTPATIENT MEDICATIONS STARTED DURING THIS VISIT:  New Prescriptions   No medications on file      Note:  This document was prepared using Dragon voice recognition software and may include unintentional dictation errors.   Hinda Kehr, MD 03/28/16 [phone]

## 2016-03-28 NOTE — H&P (Signed)
Shawn Stafford NAME: Shawn Stafford    MR#:  [id number]  DATE OF BIRTH:  08-21-1941  DATE OF ADMISSION:  03/28/2016  PRIMARY CARE PHYSICIAN: Dr. Emily Filbert  REQUESTING/REFERRING PHYSICIAN: Dr. Hinda Kehr  CHIEF COMPLAINT:   Chief Complaint  Patient presents with  . Abdominal Pain  . Back Pain    HISTORY OF PRESENT ILLNESS:  Shawn Stafford  is a 75 y.o. male with a known history of HTN, Hyperlipidemia, BPH, CKD presents from home secondary to acute abdominal pain associated with nausea and vomiting that started this morning. Patient has been in his normal state of health up until today morning. He had regular supper last night denies any symptoms, feeling sick to stomach last night before he went to bed. This morning he woke up and had breakfast and few minutes later started to have significant epigastric and right upper quadrant pain that radiated all over the abdomen and he started to have nausea and several episodes of vomiting. Denies any diarrhea. Denies any fevers or chills. No recent travel. No history of gallstones. Does not drink alcohol. Ultrasound of the abdomen here showed normal liver and gallbladder. Lipase is elevated at greater than 7177 is being admitted for acute pancreatitis. He was also started on antihelminthic for scabies by dermatologist last week. Possibly ivermectin because the family said he needed to take just 2 doses. Finished his last dose yesterday.  PAST MEDICAL HISTORY:   Past Medical History  Diagnosis Date  . Hyperlipidemia   . Hypertension   . Prostate disorder   . Depression   . Compression fracture   . CKD (chronic kidney disease)     PAST SURGICAL HISTORY:   Past Surgical History  Procedure Laterality Date  . Spine surgery    . Hernia repair    . Cataract extraction    . Kyphoplasty      SOCIAL HISTORY:   Social History  Substance Use Topics  . Smoking status: Never Smoker    . Smokeless tobacco: Never Used  . Alcohol Use: No    FAMILY HISTORY:   Family History  Problem Relation Age of Onset  . Dementia Mother   . Colon cancer Father     DRUG ALLERGIES:   Allergies  Allergen Reactions  . Fentanyl     PATCH CAUSES RASH    REVIEW OF SYSTEMS:   Review of Systems  Constitutional: Positive for malaise/fatigue. Negative for fever, chills and weight loss.  HENT: Negative for ear discharge, ear pain, nosebleeds and tinnitus.   Eyes: Negative for blurred vision, double vision and photophobia.  Respiratory: Negative for cough, hemoptysis, shortness of breath and wheezing.   Cardiovascular: Negative for chest pain, palpitations, orthopnea and leg swelling.  Gastrointestinal: Positive for nausea, vomiting and abdominal pain. Negative for heartburn, diarrhea, constipation and melena.  Genitourinary: Negative for dysuria, urgency, frequency and hematuria.  Musculoskeletal: Positive for back pain. Negative for myalgias and neck pain.  Skin: Negative for rash.  Neurological: Negative for dizziness, tingling, tremors, sensory change, speech change, focal weakness and headaches.  Endo/Heme/Allergies: Does not bruise/bleed easily.  Psychiatric/Behavioral: Negative for depression.    MEDICATIONS AT HOME:   Prior to Admission medications   Medication Sig Start Date End Date Taking? Authorizing Provider  fish oil-omega-3 fatty acids 1000 MG capsule Take 2 g by mouth daily.    Historical Provider, MD  ibuprofen (ADVIL,MOTRIN) 600 MG tablet Take 1 tablet (600 mg total)  by mouth every 8 (eight) hours as needed. 08/07/12   Charlett Blake, MD  lisinopril (PRINIVIL,ZESTRIL) 10 MG tablet Take 10 mg by mouth daily.    Historical Provider, MD  Multiple Vitamin (MULTIVITAMIN) tablet Take 1 tablet by mouth daily.    Historical Provider, MD  simvastatin (ZOCOR) 20 MG tablet Take 20 mg by mouth every evening.    Historical Provider, MD  Tamsulosin HCl (FLOMAX) 0.4 MG CAPS  Take 0.4 mg by mouth daily.    Historical Provider, MD  traMADol (ULTRAM) 50 MG tablet Take 1 tablet (50 mg total) by mouth every 8 (eight) hours as needed for pain. 01/16/13   Charlett Blake, MD  vitamin B-12 (CYANOCOBALAMIN) 1000 MCG tablet Take 1,000 mcg by mouth daily.    Historical Provider, MD      VITAL SIGNS:  Blood pressure 146/79, pulse 59, temperature 97.8 F (36.6 C), temperature source Oral, resp. rate 18, height 5\' 9"  (1.753 m), weight 84.369 kg (186 lb), SpO2 93 %.  PHYSICAL EXAMINATION:   Physical Exam  GENERAL:  75 y.o.-year-old patient lying in the bed with no acute distress.  EYES: Pupils equal, round, reactive to light and accommodation. No scleral icterus. Extraocular muscles intact.  HEENT: Head atraumatic, normocephalic. Oropharynx and nasopharynx clear.  NECK:  Supple, no jugular venous distention. No thyroid enlargement, no tenderness.  LUNGS: Normal breath sounds bilaterally, no wheezing, rales,rhonchi or crepitation. No use of accessory muscles of respiration.  CARDIOVASCULAR: S1, S2 normal. No murmurs, rubs, or gallops.  ABDOMEN: Soft, Tender in the right upper quadrant, epigastric and also left lower quadrants. No guarding or rigidity. nondistended. Bowel sounds present. No organomegaly or mass.  EXTREMITIES: No pedal edema, cyanosis, or clubbing.  NEUROLOGIC: Cranial nerves II through XII are intact. Muscle strength 5/5 in all extremities. Sensation intact. Gait not checked.  PSYCHIATRIC: The patient is alert and oriented x 3.  SKIN: No obvious rash, lesion, or ulcer.   LABORATORY PANEL:   CBC  Recent Labs Lab 03/28/16 1149  WBC 13.7*  HGB 14.6  HCT 42.4  PLT 203   ------------------------------------------------------------------------------------------------------------------  Chemistries   Recent Labs Lab 03/28/16 1149  NA 139  K 4.2  CL 107  CO2 23  GLUCOSE 165*  BUN 20  CREATININE 1.41*  CALCIUM 9.1  AST 32  ALT 32  ALKPHOS  46  BILITOT 1.0   ------------------------------------------------------------------------------------------------------------------  Cardiac Enzymes  Recent Labs Lab 03/28/16 1149  TROPONINI <0.03   ------------------------------------------------------------------------------------------------------------------  RADIOLOGY:  US Abdomen Complete  03/28/2016  CLINICAL DATA:  Right upper quadrant pain. EXAM: ABDOMEN ULTRASOUND COMPLETE COMPARISON:  None. FINDINGS: Gallbladder: No gallstones or wall thickening visualized. No sonographic Murphy sign noted by sonographer. Common bile duct: Diameter: 2.4 mm Liver: Increased echogenicity, likely hepatic steatosis. IVC: No abnormality visualized. Pancreas: Visualized portion unremarkable. Spleen: Size and appearance within normal limits. Right Kidney: Length: 10.3 cm. Linear hyperechogenicity, possibly a nonobstructing stone. Left Kidney: Length: 11.9 cm. Echogenicity within normal limits. No mass or hydronephrosis visualized. Abdominal aorta: No aneurysm visualized. Other findings: None. IMPRESSION: 1. Hepatic steatosis. 2. Possible nonobstructing stone in the right kidney. Electronically Signed   By: Dorise Bullion III M.D   On: 03/28/2016 13:48    EKG:   Orders placed or performed during the hospital encounter of 03/28/16  . ED EKG  . ED EKG    IMPRESSION AND PLAN:   Gabreal Howick  is a 75 y.o. male with a known history of HTN, Hyperlipidemia, BPH, CKD  presents from home secondary to acute abdominal pain associated with nausea and vomiting that started this morning.  #1 acute pancreatitis-likely idiopathic at this time. -Lipid panel is pending. No alcohol use history. Also no gallstones noted on ultrasound of the abdomen. -CT of the abdomen is pending. If necrosis noted or severe inflammation noted, will add IV antibiotics. -Possibly was on ivermectin recently-side effect profile does not indicate pancreatitis. Pharmacy to verify the  medicine he used. -Keep him nothing by mouth. IV fluids. Pain and nausea medicines. -Continue to monitor closely.  #2 hypertension-hold enalapril  #3 hyperlipidemia-on statin and fish oil.  #4 BPH-on Flomax  #5 DVT prophylaxis-on Lovenox   All the records are reviewed and case discussed with ED provider. Management plans discussed with the patient, family and they are in agreement.  CODE STATUS: Full code  TOTAL TIME TAKING CARE OF THIS PATIENT: 50 minutes.    Gladstone Lighter M.D on 03/28/2016 at 2:39 PM  Between 7am to 6pm - Pager - [phone]  After 6pm go to [email or web address] - password [email or web address] [facility]  [facility]  Office  [phone]  CC: Primary care physician; No primary care provider on file.

## 2016-03-29 DIAGNOSIS — E785 Hyperlipidemia, unspecified: Secondary | ICD-10-CM | POA: Diagnosis not present

## 2016-03-29 DIAGNOSIS — K859 Acute pancreatitis without necrosis or infection, unspecified: Secondary | ICD-10-CM | POA: Diagnosis not present

## 2016-03-29 DIAGNOSIS — I10 Essential (primary) hypertension: Secondary | ICD-10-CM | POA: Diagnosis not present

## 2016-03-29 DIAGNOSIS — N189 Chronic kidney disease, unspecified: Secondary | ICD-10-CM | POA: Diagnosis not present

## 2016-03-29 LAB — CBC
HCT: 47 % (ref 40.0–52.0)
Hemoglobin: 16.1 g/dL (ref 13.0–18.0)
MCH: 31.5 pg (ref 26.0–34.0)
MCHC: 34.2 g/dL (ref 32.0–36.0)
MCV: 92.1 fL (ref 80.0–100.0)
PLATELETS: 194 10*3/uL (ref 150–440)
RBC: 5.1 MIL/uL (ref 4.40–5.90)
RDW: 13.4 % (ref 11.5–14.5)
WBC: 21.3 10*3/uL — ABNORMAL HIGH (ref 3.8–10.6)

## 2016-03-29 LAB — BASIC METABOLIC PANEL
ANION GAP: 11 (ref 5–15)
BUN: 18 mg/dL (ref 6–20)
CALCIUM: 8.3 mg/dL — AB (ref 8.9–10.3)
CO2: 22 mmol/L (ref 22–32)
Chloride: 104 mmol/L (ref 101–111)
Creatinine, Ser: 1.06 mg/dL (ref 0.61–1.24)
GFR calc Af Amer: >60 mL/min (ref >60)
GLUCOSE: 156 mg/dL — AB (ref 65–99)
Potassium: 4.1 mmol/L (ref 3.5–5.1)
Sodium: 137 mmol/L (ref 135–145)

## 2016-03-29 LAB — LIPASE, BLOOD: LIPASE: 993 U/L — AB (ref 11–51)

## 2016-03-29 LAB — MAGNESIUM: MAGNESIUM: 1.5 mg/dL — AB (ref 1.7–2.4)

## 2016-03-29 MED ORDER — HYDROMORPHONE HCL 1 MG/ML IJ SOLN
1.0000 mg | INTRAMUSCULAR | Status: DC | PRN
  Administered 2016-03-29 – 2016-03-30 (×4): 1 mg via INTRAVENOUS
  Filled 2016-03-29 (×4): qty 1

## 2016-03-29 MED ORDER — MAGNESIUM SULFATE 4 GM/100ML IV SOLN
4.0000 g | Freq: Once | INTRAVENOUS | Status: AC
  Administered 2016-03-29: 4 g via INTRAVENOUS
  Filled 2016-03-29: qty 100

## 2016-03-29 MED ORDER — AMLODIPINE BESYLATE 10 MG PO TABS
10.0000 mg | ORAL_TABLET | Freq: Every day | ORAL | Status: DC
  Administered 2016-03-29 – 2016-03-31 (×3): 10 mg via ORAL
  Filled 2016-03-29 (×3): qty 1

## 2016-03-29 MED ORDER — LORATADINE 10 MG PO TABS
10.0000 mg | ORAL_TABLET | Freq: Every day | ORAL | Status: DC | PRN
  Administered 2016-03-29: 10 mg via ORAL
  Filled 2016-03-29: qty 1

## 2016-03-29 NOTE — Progress Notes (Addendum)
[facility] [place] NAME: Shawn Stafford    MR#:  [id number]  DATE OF BIRTH:  March 19, 1941  SUBJECTIVE:  CHIEF COMPLAINT:   Chief Complaint  Patient presents with  . Abdominal Pain  . Back Pain   Still has abdominal pain, constant, 6-8/10, sharp without radiation. REVIEW OF SYSTEMS:  CONSTITUTIONAL: No fever, fatigue or weakness.  EYES: No blurred or double vision.  EARS, NOSE, AND THROAT: No tinnitus or ear pain.  RESPIRATORY: No cough, shortness of breath, wheezing or hemoptysis.  CARDIOVASCULAR: No chest pain, orthopnea, edema.  GASTROINTESTINAL: has abdominal pain, nausea,  No vomiting or diarrhea. GENITOURINARY: No dysuria, hematuria.  ENDOCRINE: No polyuria, nocturia,  HEMATOLOGY: No anemia, easy bruising or bleeding SKIN: No rash or lesion. MUSCULOSKELETAL: No joint pain or arthritis.   NEUROLOGIC: No tingling, numbness, weakness.  PSYCHIATRY: No anxiety or depression.   DRUG ALLERGIES:   Allergies  Allergen Reactions  . Fentanyl     PATCH CAUSES RASH    VITALS:  Blood pressure 156/80, pulse 93, temperature 99 F (37.2 C), temperature source Oral, resp. rate 18, height 5\' 9"  (1.753 m), weight 84.369 kg (186 lb), SpO2 89 %.  PHYSICAL EXAMINATION:  GENERAL:  75 y.o.-year-old patient lying in the bed with no acute distress.  EYES: Pupils equal, round, reactive to light and accommodation. No scleral icterus. Extraocular muscles intact.  HEENT: Head atraumatic, normocephalic. Oropharynx and nasopharynx clear.  NECK:  Supple, no jugular venous distention. No thyroid enlargement, no tenderness.  LUNGS: Normal breath sounds bilaterally, no wheezing, rales,rhonchi or crepitation. No use of accessory muscles of respiration.  CARDIOVASCULAR: S1, S2 normal. No murmurs, rubs, or gallops.  ABDOMEN: Soft, Diffuse tenderness, nondistended. Bowel sounds present. No organomegaly or mass.  EXTREMITIES: No pedal edema, cyanosis, or  clubbing.  NEUROLOGIC: Cranial nerves II through XII are intact. Muscle strength 5/5 in all extremities. Sensation intact. Gait not checked.  PSYCHIATRIC: The patient is alert and oriented x 3.  SKIN: No obvious rash, lesion, or ulcer.    LABORATORY PANEL:   CBC  Recent Labs Lab 03/29/16 0454  WBC 21.3*  HGB 16.1  HCT 47.0  PLT 194   ------------------------------------------------------------------------------------------------------------------  Chemistries   Recent Labs Lab 03/28/16 1149 03/29/16 0454  NA 139 137  K 4.2 4.1  CL 107 104  CO2 23 22  GLUCOSE 165* 156*  BUN 20 18  CREATININE 1.41* 1.06  CALCIUM 9.1 8.3*  MG  --  1.5*  AST 32  --   ALT 32  --   ALKPHOS 46  --   BILITOT 1.0  --    ------------------------------------------------------------------------------------------------------------------  Cardiac Enzymes  Recent Labs Lab 03/28/16 1149  TROPONINI <0.03   ------------------------------------------------------------------------------------------------------------------  RADIOLOGY:  Ct Abdomen Pelvis Wo Contrast  03/28/2016  CLINICAL DATA:  Upper abdominal pain since 08/30 this morning. Nausea and vomiting. Elevated lipase. EXAM: CT ABDOMEN AND PELVIS WITHOUT CONTRAST TECHNIQUE: Multidetector CT imaging of the abdomen and pelvis was performed following the standard protocol without IV contrast. COMPARISON:  None. FINDINGS: Lower chest: Dependent subpleural atelectasis but no infiltrates or effusions. The heart is normal in size. Extensive coronary artery calcifications. The distal esophagus is grossly normal. Hepatobiliary: No focal hepatic lesions or intrahepatic biliary dilatation. The gallbladder is unremarkable. No common bile duct dilatation. Pancreas: Extensive inflammation above and surrounding the pancreas consistent with acute pancreatitis. There is also inflammation of the second and third portions of the duodenum. Small amount of fluid  noted in the anterior pararenal space on both sides. No pancreatic calcifications. No obvious common bile duct stone. Spleen: Normal size.  No focal lesions. Adrenals/Urinary Tract: The adrenal glands and kidneys are unremarkable. No renal or obstructing ureteral calculi. No hydronephrosis. Stomach/Bowel: The stomach is unremarkable. There is inflammation surrounding the second and third portions of the duodenum. The small bowel and colon are unremarkable. No obstructive findings or mass lesions. The terminal ileum is normal. The appendix is normal. Vascular/Lymphatic: No mesenteric or retroperitoneal mass or adenopathy. Small scattered lymph nodes are noted. There are dense atherosclerotic calcifications involving the aorta and branch vessel ostia. No aneurysm. Other: The prostate gland is mildly enlarged. The seminal vesicles appear normal. No pelvic mass or adenopathy. No free pelvic fluid collections. No inguinal mass or adenopathy. Musculoskeletal: No significant bony findings. Vertebral augmentation changes noted at L3 and L4. IMPRESSION: 1. CT findings consistent with acute pancreatitis. 2. Advanced atherosclerotic calcifications involving the aorta and branch vessels and coronary arteries. Electronically Signed   By: Marijo Sanes M.D.   On: 03/28/2016 17:20   US Abdomen Complete  03/28/2016  CLINICAL DATA:  Right upper quadrant pain. EXAM: ABDOMEN ULTRASOUND COMPLETE COMPARISON:  None. FINDINGS: Gallbladder: No gallstones or wall thickening visualized. No sonographic Murphy sign noted by sonographer. Common bile duct: Diameter: 2.4 mm Liver: Increased echogenicity, likely hepatic steatosis. IVC: No abnormality visualized. Pancreas: Visualized portion unremarkable. Spleen: Size and appearance within normal limits. Right Kidney: Length: 10.3 cm. Linear hyperechogenicity, possibly a nonobstructing stone. Left Kidney: Length: 11.9 cm. Echogenicity within normal limits. No mass or hydronephrosis visualized.  Abdominal aorta: No aneurysm visualized. Other findings: None. IMPRESSION: 1. Hepatic steatosis. 2. Possible nonobstructing stone in the right kidney. Electronically Signed   By: Dorise Bullion III M.D   On: 03/28/2016 13:48    EKG:   Orders placed or performed during the hospital encounter of 03/28/16  . ED EKG  . ED EKG    ASSESSMENT AND PLAN:   Kahari Mould is a 75 y.o. male with a known history of HTN, Hyperlipidemia, BPH, CKD presents from home secondary to acute abdominal pain associated with nausea and vomiting that started this morning.  #1 acute pancreatitis-likely idiopathic at this time. -Lipid panel is Normal. No alcohol use history. Also no gallstones noted on ultrasound of the abdomen. -CT of the abdomen: Acute pancreatitis. Lipase is improving. I started clear liquid diet this morning but the patient could not tolerate and is still has abdominal pain.so keep nothing by mouth. IV fluids. Pain and nausea medicines. Follow-up lipase.  #2 hypertension-hold enalapril, Start Norvasc.  #3 hyperlipidemia-on statin and fish oil.  #4 BPH-on Flomax  CKD stage III. Improved to stage II. Hypomagnesemia. Give magnesium IV and a follow-up level. Leukocytosis, due to reaction. Follow-up CBC.   All the records are reviewed and case discussed with Care Management/Social Workerr. Management plans discussed with the patient, His wife and they are in agreement.  CODE STATUS: Full code  TOTAL TIME TAKING CARE OF THIS PATIENT: 37 minutes.  Greater than 50% time was spent on coordination of care and face-to-face counseling.  POSSIBLE D/C IN 2 DAYS, DEPENDING ON CLINICAL CONDITION.   Demetrios Loll M.D on 03/29/2016 at 1:19 PM  Between 7am to 6pm - Pager - [phone]  After 6pm go to [email or web address] - password [email or web address] [facility]  [place] [facility]  Office  [phone]  CC: Primary care physician; No primary care provider on file.

## 2016-03-30 DIAGNOSIS — R000 Tachycardia, unspecified: Secondary | ICD-10-CM | POA: Diagnosis not present

## 2016-03-30 DIAGNOSIS — K859 Acute pancreatitis without necrosis or infection, unspecified: Secondary | ICD-10-CM | POA: Diagnosis not present

## 2016-03-30 DIAGNOSIS — N189 Chronic kidney disease, unspecified: Secondary | ICD-10-CM | POA: Diagnosis not present

## 2016-03-30 DIAGNOSIS — E785 Hyperlipidemia, unspecified: Secondary | ICD-10-CM | POA: Diagnosis not present

## 2016-03-30 DIAGNOSIS — I10 Essential (primary) hypertension: Secondary | ICD-10-CM | POA: Diagnosis not present

## 2016-03-30 LAB — CBC
HCT: 44.9 % (ref 40.0–52.0)
Hemoglobin: 15.6 g/dL (ref 13.0–18.0)
MCH: 32 pg (ref 26.0–34.0)
MCHC: 34.6 g/dL (ref 32.0–36.0)
MCV: 92.2 fL (ref 80.0–100.0)
PLATELETS: 170 10*3/uL (ref 150–440)
RBC: 4.87 MIL/uL (ref 4.40–5.90)
RDW: 13.2 % (ref 11.5–14.5)
WBC: 25.4 10*3/uL — AB (ref 3.8–10.6)

## 2016-03-30 LAB — BASIC METABOLIC PANEL
Anion gap: 8 (ref 5–15)
BUN: 21 mg/dL — AB (ref 6–20)
CALCIUM: 8.3 mg/dL — AB (ref 8.9–10.3)
CO2: 25 mmol/L (ref 22–32)
CREATININE: 1.24 mg/dL (ref 0.61–1.24)
Chloride: 103 mmol/L (ref 101–111)
GFR, EST NON AFRICAN AMERICAN: 55 mL/min — AB (ref >60)
Glucose, Bld: 170 mg/dL — ABNORMAL HIGH (ref 65–99)
Potassium: 4.1 mmol/L (ref 3.5–5.1)
SODIUM: 136 mmol/L (ref 135–145)

## 2016-03-30 LAB — LIPASE, BLOOD: LIPASE: 321 U/L — AB (ref 11–51)

## 2016-03-30 LAB — MAGNESIUM: MAGNESIUM: 2 mg/dL (ref 1.7–2.4)

## 2016-03-30 MED ORDER — DIPHENHYDRAMINE HCL 25 MG PO CAPS
25.0000 mg | ORAL_CAPSULE | Freq: Four times a day (QID) | ORAL | Status: DC | PRN
  Administered 2016-03-30: 25 mg via ORAL
  Filled 2016-03-30: qty 1

## 2016-03-30 MED ORDER — METOPROLOL TARTRATE 25 MG PO TABS
25.0000 mg | ORAL_TABLET | Freq: Two times a day (BID) | ORAL | Status: DC
  Administered 2016-03-30 – 2016-03-31 (×3): 25 mg via ORAL
  Filled 2016-03-30 (×4): qty 1

## 2016-03-30 MED ORDER — MORPHINE SULFATE (PF) 2 MG/ML IV SOLN
2.0000 mg | INTRAVENOUS | Status: DC | PRN
  Administered 2016-03-30 – 2016-03-31 (×3): 2 mg via INTRAVENOUS
  Filled 2016-03-30 (×2): qty 1

## 2016-03-30 NOTE — Progress Notes (Signed)
PT Cancellation Note  Patient Details Name: Shawn Stafford MRN: [id number] DOB: [birth year]   Cancelled Treatment:     Pt screened for PT services this date.  Pt and RN report pt has been up walking in hallways 2x today without any deficits.  Pt endorses feeling weaker than baseline, but able to complete all bed mobility and transfers himself and without assist.  Pt lives with wife who is available to assist as needed when pt returns home.  Educated pt to return to activity slowly once returns home, which includes walking to [employer] and gardening.  All questions answered, no needs identified to pursue formal PT Evaluation.  RN notified of outcome.  [name], PT 03/30/2016, 1:31 PM

## 2016-03-30 NOTE — Progress Notes (Signed)
MD notified of sustained HR in the high 160's. Orders for STAT EKG put in. BP WDL.

## 2016-03-30 NOTE — Progress Notes (Signed)
Pt walked about 50 ft independently. Pt reported feeling weak, but no assistance was required.

## 2016-03-30 NOTE — Progress Notes (Signed)
[place] [facility] [place] NAME: Shawn Stafford    MR#:  [id number]  DATE OF BIRTH:  1941-07-18  SUBJECTIVE:  CHIEF COMPLAINT:   Chief Complaint  Patient presents with  . Abdominal Pain  . Back Pain   better abdominal pain, 5/10. REVIEW OF SYSTEMS:  CONSTITUTIONAL: No fever, fatigue or weakness.  EYES: No blurred or double vision.  EARS, NOSE, AND THROAT: No tinnitus or ear pain.  RESPIRATORY: No cough, shortness of breath, wheezing or hemoptysis.  CARDIOVASCULAR: No chest pain, orthopnea, edema.  GASTROINTESTINAL: has abdominal pain, no nausea,  No vomiting or diarrhea. GENITOURINARY: No dysuria, hematuria.  ENDOCRINE: No polyuria, nocturia,  HEMATOLOGY: No anemia, easy bruising or bleeding SKIN: No rash or lesion. MUSCULOSKELETAL: No joint pain or arthritis.   NEUROLOGIC: No tingling, numbness, weakness.  PSYCHIATRY: No anxiety or depression.   DRUG ALLERGIES:   Allergies  Allergen Reactions  . Fentanyl     PATCH CAUSES RASH    VITALS:  Blood pressure 167/72, pulse 103, temperature 98.1 F (36.7 C), temperature source Oral, resp. rate 17, height 5\' 9"  (1.753 m), weight 84.369 kg (186 lb), SpO2 96 %.  PHYSICAL EXAMINATION:  GENERAL:  75 y.o.-year-old patient lying in the bed with no acute distress. Obese. EYES: Pupils equal, round, reactive to light and accommodation. No scleral icterus. Extraocular muscles intact.  HEENT: Head atraumatic, normocephalic. Oropharynx and nasopharynx clear.  NECK:  Supple, no jugular venous distention. No thyroid enlargement, no tenderness.  LUNGS: Normal breath sounds bilaterally, no wheezing, rales,rhonchi or crepitation. No use of accessory muscles of respiration.  CARDIOVASCULAR: S1, S2 normal. No murmurs, rubs, or gallops.  ABDOMEN: Soft, Diffuse tenderness, nondistended. Bowel sounds present. No organomegaly or mass.  EXTREMITIES: No pedal edema, cyanosis, or clubbing.  NEUROLOGIC:  Cranial nerves II through XII are intact. Muscle strength 5/5 in all extremities. Sensation intact. Gait not checked.  PSYCHIATRIC: The patient is alert and oriented x 3.  SKIN: No obvious rash, lesion, or ulcer.    LABORATORY PANEL:   CBC  Recent Labs Lab 03/30/16 0504  WBC 25.4*  HGB 15.6  HCT 44.9  PLT 170   ------------------------------------------------------------------------------------------------------------------  Chemistries   Recent Labs Lab 03/28/16 1149  03/30/16 0504  NA 139  < > 136  K 4.2  < > 4.1  CL 107  < > 103  CO2 23  < > 25  GLUCOSE 165*  < > 170*  BUN 20  < > 21*  CREATININE 1.41*  < > 1.24  CALCIUM 9.1  < > 8.3*  MG  --   < > 2.0  AST 32  --   --   ALT 32  --   --   ALKPHOS 46  --   --   BILITOT 1.0  --   --   < > = values in this interval not displayed. ------------------------------------------------------------------------------------------------------------------  Cardiac Enzymes  Recent Labs Lab 03/28/16 1149  TROPONINI <0.03   ------------------------------------------------------------------------------------------------------------------  RADIOLOGY:  Ct Abdomen Pelvis Wo Contrast  03/28/2016  CLINICAL DATA:  Upper abdominal pain since 08/30 this morning. Nausea and vomiting. Elevated lipase. EXAM: CT ABDOMEN AND PELVIS WITHOUT CONTRAST TECHNIQUE: Multidetector CT imaging of the abdomen and pelvis was performed following the standard protocol without IV contrast. COMPARISON:  None. FINDINGS: Lower chest: Dependent subpleural atelectasis but no infiltrates or effusions. The heart is normal in size. Extensive coronary artery calcifications. The distal esophagus is grossly normal. Hepatobiliary: No focal  hepatic lesions or intrahepatic biliary dilatation. The gallbladder is unremarkable. No common bile duct dilatation. Pancreas: Extensive inflammation above and surrounding the pancreas consistent with acute pancreatitis. There is also  inflammation of the second and third portions of the duodenum. Small amount of fluid noted in the anterior pararenal space on both sides. No pancreatic calcifications. No obvious common bile duct stone. Spleen: Normal size.  No focal lesions. Adrenals/Urinary Tract: The adrenal glands and kidneys are unremarkable. No renal or obstructing ureteral calculi. No hydronephrosis. Stomach/Bowel: The stomach is unremarkable. There is inflammation surrounding the second and third portions of the duodenum. The small bowel and colon are unremarkable. No obstructive findings or mass lesions. The terminal ileum is normal. The appendix is normal. Vascular/Lymphatic: No mesenteric or retroperitoneal mass or adenopathy. Small scattered lymph nodes are noted. There are dense atherosclerotic calcifications involving the aorta and branch vessel ostia. No aneurysm. Other: The prostate gland is mildly enlarged. The seminal vesicles appear normal. No pelvic mass or adenopathy. No free pelvic fluid collections. No inguinal mass or adenopathy. Musculoskeletal: No significant bony findings. Vertebral augmentation changes noted at L3 and L4. IMPRESSION: 1. CT findings consistent with acute pancreatitis. 2. Advanced atherosclerotic calcifications involving the aorta and branch vessels and coronary arteries. Electronically Signed   By: Marijo Sanes M.D.   On: 03/28/2016 17:20   US Abdomen Complete  03/28/2016  CLINICAL DATA:  Right upper quadrant pain. EXAM: ABDOMEN ULTRASOUND COMPLETE COMPARISON:  None. FINDINGS: Gallbladder: No gallstones or wall thickening visualized. No sonographic Murphy sign noted by sonographer. Common bile duct: Diameter: 2.4 mm Liver: Increased echogenicity, likely hepatic steatosis. IVC: No abnormality visualized. Pancreas: Visualized portion unremarkable. Spleen: Size and appearance within normal limits. Right Kidney: Length: 10.3 cm. Linear hyperechogenicity, possibly a nonobstructing stone. Left Kidney: Length:  11.9 cm. Echogenicity within normal limits. No mass or hydronephrosis visualized. Abdominal aorta: No aneurysm visualized. Other findings: None. IMPRESSION: 1. Hepatic steatosis. 2. Possible nonobstructing stone in the right kidney. Electronically Signed   By: Dorise Bullion III M.D   On: 03/28/2016 13:48    EKG:   Orders placed or performed during the hospital encounter of 03/28/16  . ED EKG  . ED EKG    ASSESSMENT AND PLAN:   Haben Billingsley is a 75 y.o. male with a known history of HTN, Hyperlipidemia, BPH, CKD presents from home secondary to acute abdominal pain associated with nausea and vomiting that started this morning.  #1 acute pancreatitis-likely idiopathic at this time. -Lipid panel is Normal. No alcohol use history. Also no gallstones noted on ultrasound of the abdomen. -CT of the abdomen: Acute pancreatitis. Lipase is improving. I started clear liquid diet this morning, continue IV fluids. Pain and nausea medicines. Follow-up lipase.  #2 hypertension-hold enalapril, Started Norvasc.  #3 hyperlipidemia-on statin and fish oil.  #4 BPH-on Flomax  CKD stage III. Improved to stage II. Hypomagnesemia. Improved with magnesium IV. Leukocytosis, due to reaction. WBC is still high, Follow-up CBC.   All the records are reviewed and case discussed with Care Management/Social Workerr. Management plans discussed with the patient, His wife and they are in agreement.  CODE STATUS: Full code  TOTAL TIME TAKING CARE OF THIS PATIENT: 33 minutes.  Greater than 50% time was spent on coordination of care and face-to-face counseling.  POSSIBLE D/C IN 1-2 DAYS, DEPENDING ON CLINICAL CONDITION.   Demetrios Loll M.D on 03/30/2016 at 1:37 PM  Between 7am to 6pm - Pager - [phone]  After 6pm go to [email or web address] -  password [email or web address] [facility]  [facility]  Office  [phone]  CC: Primary care physician; No primary care provider on file.

## 2016-03-30 NOTE — Care Management Important Message (Signed)
Important Message  Patient Details  Name: Shawn Stafford MRN: [id number] Date of Birth: 1941/06/16   Medicare Important Message Given:  Yes    Juliann Pulse A Latera Mclin 03/30/2016, 2:07 PM

## 2016-03-31 DIAGNOSIS — I10 Essential (primary) hypertension: Secondary | ICD-10-CM | POA: Diagnosis not present

## 2016-03-31 DIAGNOSIS — E785 Hyperlipidemia, unspecified: Secondary | ICD-10-CM | POA: Diagnosis not present

## 2016-03-31 DIAGNOSIS — K859 Acute pancreatitis without necrosis or infection, unspecified: Secondary | ICD-10-CM | POA: Diagnosis not present

## 2016-03-31 DIAGNOSIS — N189 Chronic kidney disease, unspecified: Secondary | ICD-10-CM | POA: Diagnosis not present

## 2016-03-31 LAB — CBC
HCT: 42.1 % (ref 40.0–52.0)
HEMOGLOBIN: 14.4 g/dL (ref 13.0–18.0)
MCH: 32.2 pg (ref 26.0–34.0)
MCHC: 34.1 g/dL (ref 32.0–36.0)
MCV: 94.3 fL (ref 80.0–100.0)
PLATELETS: 157 10*3/uL (ref 150–440)
RBC: 4.47 MIL/uL (ref 4.40–5.90)
RDW: 13.3 % (ref 11.5–14.5)
WBC: 21.3 10*3/uL — ABNORMAL HIGH (ref 3.8–10.6)

## 2016-03-31 LAB — LIPASE, BLOOD: Lipase: 57 U/L — ABNORMAL HIGH (ref 11–51)

## 2016-03-31 LAB — BASIC METABOLIC PANEL
Anion gap: 9 (ref 5–15)
BUN: 23 mg/dL — AB (ref 6–20)
CHLORIDE: 98 mmol/L — AB (ref 101–111)
CO2: 25 mmol/L (ref 22–32)
Calcium: 8.8 mg/dL — ABNORMAL LOW (ref 8.9–10.3)
Creatinine, Ser: 1.12 mg/dL (ref 0.61–1.24)
GFR calc Af Amer: >60 mL/min (ref >60)
GFR calc non Af Amer: >60 mL/min (ref >60)
GLUCOSE: 139 mg/dL — AB (ref 65–99)
POTASSIUM: 4.1 mmol/L (ref 3.5–5.1)
Sodium: 132 mmol/L — ABNORMAL LOW (ref 135–145)

## 2016-03-31 MED ORDER — METOPROLOL TARTRATE 25 MG PO TABS: 25.0000 mg | ORAL_TABLET | Freq: Two times a day (BID) | ORAL | Status: AC

## 2016-03-31 MED ORDER — HYDROCODONE-ACETAMINOPHEN 5-325 MG PO TABS: 1.0000 | ORAL_TABLET | Freq: Three times a day (TID) | ORAL | Status: DC | PRN

## 2016-03-31 NOTE — Discharge Summary (Signed)
[facility] [place] NAME: Shawn Stafford    MR#:  [id number]  DATE OF BIRTH:  1940/12/25  DATE OF ADMISSION:  03/28/2016 ADMITTING PHYSICIAN: Gladstone Lighter, MD  DATE OF DISCHARGE: 03/31/2016 11:38 AM  PRIMARY CARE PHYSICIAN: No primary care provider on file.    ADMISSION DIAGNOSIS:  Acute pancreatitis [K85.90] Acute pancreatitis, unspecified complication status, unspecified pancreatitis type [K85.90]   DISCHARGE DIAGNOSIS:   acute pancreatitis SECONDARY DIAGNOSIS:   Past Medical History  Diagnosis Date  . Hyperlipidemia   . Hypertension   . Prostate disorder   . Depression   . Compression fracture   . CKD (chronic kidney disease)     HOSPITAL COURSE:   Shawn Stafford is a 75 y.o. male with a known history of HTN, Hyperlipidemia, BPH, CKD presents from home secondary to acute abdominal pain associated with nausea and vomiting that started this morning.  #1 acute pancreatitis-likely idiopathic at this time. -Lipid panel is Normal. No alcohol use history. Also no gallstones noted on ultrasound of the abdomen. -CT of the abdomen: Acute pancreatitis. Lipase is improved.He tolerated diet. He was on IV fluids. Pain and nausea medicines. Symptoms improved.  #2 hypertension-hold enalapril, Started Norvasc.  #3 hyperlipidemia-on statin and fish oil.  #4 BPH-on Flomax  CKD stage III. Improved to stage II. Hypomagnesemia. Improved with magnesium IV. Leukocytosis, due to reaction. WBC is better but still high, Follow-up CBC as outpatient.   DISCHARGE CONDITIONS:   Stable, discharged to home today.  CONSULTS OBTAINED:     DRUG ALLERGIES:   Allergies  Allergen Reactions  . Fentanyl     PATCH CAUSES RASH    DISCHARGE MEDICATIONS:   Discharge Medication List as of 03/31/2016 10:29 AM    START taking these medications   Details  HYDROcodone-acetaminophen (NORCO/VICODIN) 5-325 MG tablet Take 1 tablet by mouth  every 8 (eight) hours as needed for moderate pain., Starting 03/31/2016, Until Discontinued, Print    metoprolol tartrate (LOPRESSOR) 25 MG tablet Take 1 tablet (25 mg total) by mouth 2 (two) times daily., Starting 03/31/2016, Until Discontinued, Print      CONTINUE these medications which have NOT CHANGED   Details  acetaminophen (TYLENOL) 500 MG tablet Take 1,000 mg by mouth 3 (three) times daily as needed for mild pain or moderate pain., Until Discontinued, Historical Med    amLODipine (NORVASC) 5 MG tablet Take 5 mg by mouth daily., Starting 07/04/2015, Until Discontinued, Historical Med    aspirin 81 MG chewable tablet Chew 81 mg by mouth every morning., Until Discontinued, Historical Med    Cholecalciferol (VITAMIN D3) 2000 units capsule Take 2,000 Units by mouth daily., Until Discontinued, Historical Med    Coenzyme Q10 (CO Q 10) 10 MG CAPS Take 10 mg by mouth daily., Until Discontinued, Historical Med    fish oil-omega-3 fatty acids 1000 MG capsule Take 1 g by mouth daily. , Until Discontinued, Historical Med    Multiple Vitamin (MULTIVITAMIN) tablet Take 1 tablet by mouth daily., Until Discontinued, Historical Med    simvastatin (ZOCOR) 20 MG tablet Take 10 mg by mouth every evening. , Until Discontinued, Historical Med    Tamsulosin HCl (FLOMAX) 0.4 MG CAPS Take 0.4 mg by mouth daily. Take 30 minutes after same meal every day., Until Discontinued, Historical Med    traMADol (ULTRAM) 50 MG tablet Take 1 tablet (50 mg total) by mouth every 8 (eight) hours as needed for pain., Starting 01/16/2013, Until Discontinued, Normal  vitamin B-12 (CYANOCOBALAMIN) 1000 MCG tablet Take 1,000 mcg by mouth daily., Until Discontinued, Historical Med         DISCHARGE INSTRUCTIONS:    If you experience worsening of your admission symptoms, develop shortness of breath, life threatening emergency, suicidal or homicidal thoughts you must seek medical attention immediately by calling 911 or  calling your MD immediately  if symptoms less severe.  You Must read complete instructions/literature along with all the possible adverse reactions/side effects for all the Medicines you take and that have been prescribed to you. Take any new Medicines after you have completely understood and accept all the possible adverse reactions/side effects.   Please note  You were cared for by a hospitalist during your hospital stay. If you have any questions about your discharge medications or the care you received while you were in the hospital after you are discharged, you can call the unit and asked to speak with the hospitalist on call if the hospitalist that took care of you is not available. Once you are discharged, your primary care physician will handle any further medical issues. Please note that NO REFILLS for any discharge medications will be authorized once you are discharged, as it is imperative that you return to your primary care physician (or establish a relationship with a primary care physician if you do not have one) for your aftercare needs so that they can reassess your need for medications and monitor your lab values.    Today   SUBJECTIVE   No complaint.   VITAL SIGNS:  Blood pressure 162/79, pulse 85, temperature 99.3 F (37.4 C), temperature source Oral, resp. rate 20, height 5\' 9"  (1.753 m), weight 84.369 kg (186 lb), SpO2 94 %.  I/O:   Intake/Output Summary (Last 24 hours) at 03/31/16 1314 Last data filed at 03/31/16 0900  Gross per 24 hour  Intake    442 ml  Output      0 ml  Net    442 ml    PHYSICAL EXAMINATION:  GENERAL:  75 y.o.-year-old patient lying in the bed with no acute distress.  EYES: Pupils equal, round, reactive to light and accommodation. No scleral icterus. Extraocular muscles intact.  HEENT: Head atraumatic, normocephalic. Oropharynx and nasopharynx clear.  NECK:  Supple, no jugular venous distention. No thyroid enlargement, no tenderness.   LUNGS: Normal breath sounds bilaterally, no wheezing, rales,rhonchi or crepitation. No use of accessory muscles of respiration.  CARDIOVASCULAR: S1, S2 normal. No murmurs, rubs, or gallops.  ABDOMEN: Soft, non-tender, non-distended. Bowel sounds present. No organomegaly or mass.  EXTREMITIES: No pedal edema, cyanosis, or clubbing.  NEUROLOGIC: Cranial nerves II through XII are intact. Muscle strength 5/5 in all extremities. Sensation intact. Gait not checked.  PSYCHIATRIC: The patient is alert and oriented x 3.  SKIN: No obvious rash, lesion, or ulcer.   DATA REVIEW:   CBC  Recent Labs Lab 03/31/16 0347  WBC 21.3*  HGB 14.4  HCT 42.1  PLT 157    Chemistries   Recent Labs Lab 03/28/16 1149  03/30/16 0504 03/31/16 0347  NA 139  < > 136 132*  K 4.2  < > 4.1 4.1  CL 107  < > 103 98*  CO2 23  < > 25 25  GLUCOSE 165*  < > 170* 139*  BUN 20  < > 21* 23*  CREATININE 1.41*  < > 1.24 1.12  CALCIUM 9.1  < > 8.3* 8.8*  MG  --   < >  2.0  --   AST 32  --   --   --   ALT 32  --   --   --   ALKPHOS 46  --   --   --   BILITOT 1.0  --   --   --   < > = values in this interval not displayed.  Cardiac Enzymes  Recent Labs Lab 03/28/16 1149  TROPONINI <0.03    Microbiology Results  No results found for this or any previous visit.  RADIOLOGY:  No results found.      Management plans discussed with the patient, his wife and they are in agreement.  CODE STATUS:     Code Status Orders        Start     Ordered   03/28/16 1558  Full code   Continuous     03/28/16 1557    Code Status History    Date Active Date Inactive Code Status Order ID Comments User Context   This patient has a current code status but no historical code status.      TOTAL TIME TAKING CARE OF THIS PATIENT: 32 minutes.    Demetrios Loll M.D on 03/31/2016 at 1:14 PM  Between 7am to 6pm - Pager - [phone]  After 6pm go to [email or web address] - password [email or web address] [email or web address]  [facility]   Office  [phone]  CC: Primary care physician; No primary care provider on file.

## 2016-03-31 NOTE — Progress Notes (Signed)
Pt A and O x 4. VSS. Pt tolerating diet well. No complaints of pain or nausea. IV removed intact, prescriptions given. Pt voiced understanding of discharge instructions with no further questions. Pt discharged via wheelchair with axillary.   

## 2016-03-31 NOTE — Discharge Instructions (Signed)
Low fat, heart healthy diet. Activity as tolerated. Follow up CBC with PCP.

## 2016-04-05 DIAGNOSIS — E878 Other disorders of electrolyte and fluid balance, not elsewhere classified: Secondary | ICD-10-CM | POA: Diagnosis not present

## 2016-04-05 DIAGNOSIS — I471 Supraventricular tachycardia: Secondary | ICD-10-CM | POA: Diagnosis not present

## 2016-04-05 DIAGNOSIS — I10 Essential (primary) hypertension: Secondary | ICD-10-CM | POA: Diagnosis not present

## 2016-04-05 DIAGNOSIS — K859 Acute pancreatitis without necrosis or infection, unspecified: Secondary | ICD-10-CM | POA: Diagnosis not present

## 2016-04-20 DIAGNOSIS — K859 Acute pancreatitis without necrosis or infection, unspecified: Secondary | ICD-10-CM | POA: Diagnosis not present

## 2016-04-27 ENCOUNTER — Other Ambulatory Visit: Payer: Self-pay | Admitting: *Deleted

## 2016-04-27 ENCOUNTER — Encounter: Payer: Self-pay | Admitting: *Deleted

## 2016-04-27 NOTE — Patient Outreach (Addendum)
[facility]) Care Management  04/27/2016  Shawn Stafford [birth year] [id number]   Subjective: Telephone call to patient's home, spoke with patient, and HIPAA verified.   Discussed [facility] Care Management services and patient in agreement to complete telephone screen. Patient states he is doing very well, does not have any transition of care, care coordination, disease management, disease monitoring, or community resources needs at this time.  Patient states he had a recent admission for pancreatitis and now his levels are back to normal.   Patient states he saw his primary MD (Dr. Emily Filbert) since discharge from hospital and received a good report.  Patient in agreement to receive [facility] Care Management information.    Objective: Per chart review: Patient hospitalized 03/28/16  - 03/31/16 for acute pancreatitis.  Patient has a history hypertension, hyperlipidemia, chronic kidney disease stage 2 -3, depression, and BPH.    Assessment: Received [facility] referral on 04/22/16.  Referral source: Jettie Booze.   Referral reason: Disease and symptom management.  [facility] diagnosis acute pancreatitis, length of stay 3 days, and moderate severe renal disease.   Services requested: [facility].  Telephone screen completed.   Patient has no [facility] Care Management program needs at this time.  Plan: RNCM will send patient's primary MD case closure due to refusal of services / no care management needs. RNCM will send patient successful outreach letter, [facility] pamphlet, and magnet. RNCM will send case closure due to no care management needs request to Josepha Pigg at [facility].  Peighton Mehra H. Annia Friendly, BSN, [place] [facility] [facility] Telephonic CM Phone: [phone] Fax: [phone]

## 2016-06-27 ENCOUNTER — Emergency Department: Payer: PPO

## 2016-06-27 ENCOUNTER — Encounter: Payer: Self-pay | Admitting: Emergency Medicine

## 2016-06-27 ENCOUNTER — Emergency Department
Admission: EM | Admit: 2016-06-27 | Discharge: 2016-06-27 | Disposition: A | Discharge status: Home / Self Care | Payer: PPO | Attending: Emergency Medicine | Admitting: Emergency Medicine

## 2016-06-27 DIAGNOSIS — N189 Chronic kidney disease, unspecified: Secondary | ICD-10-CM | POA: Insufficient documentation

## 2016-06-27 DIAGNOSIS — R1032 Left lower quadrant pain: Secondary | ICD-10-CM | POA: Diagnosis not present

## 2016-06-27 DIAGNOSIS — I129 Hypertensive chronic kidney disease with stage 1 through stage 4 chronic kidney disease, or unspecified chronic kidney disease: Secondary | ICD-10-CM | POA: Diagnosis not present

## 2016-06-27 DIAGNOSIS — K529 Noninfective gastroenteritis and colitis, unspecified: Secondary | ICD-10-CM

## 2016-06-27 DIAGNOSIS — K353 Acute appendicitis with localized peritonitis: Secondary | ICD-10-CM | POA: Diagnosis not present

## 2016-06-27 DIAGNOSIS — Z7982 Long term (current) use of aspirin: Secondary | ICD-10-CM | POA: Insufficient documentation

## 2016-06-27 DIAGNOSIS — K6389 Other specified diseases of intestine: Secondary | ICD-10-CM

## 2016-06-27 DIAGNOSIS — Z79899 Other long term (current) drug therapy: Secondary | ICD-10-CM | POA: Insufficient documentation

## 2016-06-27 DIAGNOSIS — Q438 Other specified congenital malformations of intestine: Secondary | ICD-10-CM | POA: Diagnosis not present

## 2016-06-27 LAB — CBC WITH DIFFERENTIAL/PLATELET
BASOS ABS: 0.1 10*3/uL (ref 0–0.1)
Basophils Relative: 1 %
EOS PCT: 14 %
Eosinophils Absolute: 1.5 10*3/uL — ABNORMAL HIGH (ref 0–0.7)
HCT: 40.4 % (ref 40.0–52.0)
Hemoglobin: 14.1 g/dL (ref 13.0–18.0)
LYMPHS ABS: 2.7 10*3/uL (ref 1.0–3.6)
Lymphocytes Relative: 25 %
MCH: 31.9 pg (ref 26.0–34.0)
MCHC: 34.9 g/dL (ref 32.0–36.0)
MCV: 91.4 fL (ref 80.0–100.0)
MONO ABS: 0.9 10*3/uL (ref 0.2–1.0)
MONOS PCT: 9 %
NEUTROS ABS: 5.6 10*3/uL (ref 1.4–6.5)
Neutrophils Relative %: 51 %
PLATELETS: 201 10*3/uL (ref 150–440)
RBC: 4.42 MIL/uL (ref 4.40–5.90)
RDW: 13.7 % (ref 11.5–14.5)
WBC: 10.8 10*3/uL — ABNORMAL HIGH (ref 3.8–10.6)

## 2016-06-27 LAB — COMPREHENSIVE METABOLIC PANEL
ALT: 17 U/L (ref 17–63)
AST: 24 U/L (ref 15–41)
Albumin: 4.2 g/dL (ref 3.5–5.0)
Alkaline Phosphatase: 45 U/L (ref 38–126)
Anion gap: 8 (ref 5–15)
BILIRUBIN TOTAL: 0.6 mg/dL (ref 0.3–1.2)
BUN: 23 mg/dL — AB (ref 6–20)
CALCIUM: 9.4 mg/dL (ref 8.9–10.3)
CHLORIDE: 107 mmol/L (ref 101–111)
CO2: 23 mmol/L (ref 22–32)
Creatinine, Ser: 1.42 mg/dL — ABNORMAL HIGH (ref 0.61–1.24)
GFR, EST AFRICAN AMERICAN: 54 mL/min — AB (ref >60)
GFR, EST NON AFRICAN AMERICAN: 46 mL/min — AB (ref >60)
GLUCOSE: 114 mg/dL — AB (ref 65–99)
Potassium: 4.1 mmol/L (ref 3.5–5.1)
Sodium: 138 mmol/L (ref 135–145)
TOTAL PROTEIN: 6.9 g/dL (ref 6.5–8.1)

## 2016-06-27 LAB — URINALYSIS COMPLETE WITH MICROSCOPIC (ARMC ONLY)
BACTERIA UA: NONE SEEN
Bilirubin Urine: NEGATIVE
GLUCOSE, UA: NEGATIVE mg/dL
HGB URINE DIPSTICK: NEGATIVE
Ketones, ur: NEGATIVE mg/dL
Leukocytes, UA: NEGATIVE
Nitrite: NEGATIVE
PROTEIN: NEGATIVE mg/dL
Specific Gravity, Urine: 1.017 (ref 1.005–1.030)
pH: 5 (ref 5.0–8.0)

## 2016-06-27 LAB — LIPASE, BLOOD: Lipase: 20 U/L (ref 11–51)

## 2016-06-27 MED ORDER — DIATRIZOATE MEGLUMINE & SODIUM 66-10 % PO SOLN
15.0000 mL | Freq: Once | ORAL | Status: AC
  Administered 2016-06-27: 15 mL via ORAL

## 2016-06-27 MED ORDER — IOPAMIDOL (ISOVUE-300) INJECTION 61%
100.0000 mL | Freq: Once | INTRAVENOUS | Status: AC | PRN
  Administered 2016-06-27: 100 mL via INTRAVENOUS

## 2016-06-27 MED ORDER — HYDROCODONE-ACETAMINOPHEN 5-325 MG PO TABS: 1.0000 | ORAL_TABLET | ORAL | 0 refills | Status: AC | PRN

## 2016-06-27 MED ORDER — DOCUSATE SODIUM 100 MG PO CAPS: 100.0000 mg | ORAL_CAPSULE | Freq: Two times a day (BID) | ORAL | 0 refills | Status: AC

## 2016-06-27 NOTE — ED Provider Notes (Signed)
[facility] Emergency Department Provider Note   ____________________________________________    I have reviewed the triage vital signs and the nursing notes.   HISTORY  Chief Complaint Abdominal Pain     HPI NILS HONOLD is a 75 y.o. male who presents with complaints of abdominal pain. Patient reports over the last 3 days he has had worsening pain in his left lower quadrant. He also complains of constipation. He denies nausea or vomiting. He reports normal appetite. He has never had this before. No fevers or chills. No blood in his urine. He has had a hernia repair surgery in the past   Past Medical History:  Diagnosis Date  . CKD (chronic kidney disease)   . Compression fracture   . Depression   . Hyperlipidemia   . Hypertension   . Prostate disorder     Patient Active Problem List   Diagnosis Date Noted  . Acute pancreatitis 03/28/2016  . Lumbar spondylosis 09/21/2012  . Cervical spondylosis 08/07/2012  . Cervical strain 08/07/2012  . Spinal stenosis of lumbar region with radiculopathy 08/07/2012    Past Surgical History:  Procedure Laterality Date  . CATARACT EXTRACTION    . HERNIA REPAIR    . KYPHOPLASTY    . SPINE SURGERY      Prior to Admission medications   Medication Sig Start Date End Date Taking? Authorizing Provider  acetaminophen (TYLENOL) 500 MG tablet Take 1,000 mg by mouth 3 (three) times daily as needed for mild pain or moderate pain.    Historical Provider, MD  amLODipine (NORVASC) 5 MG tablet Take 5 mg by mouth daily. 07/04/15   Historical Provider, MD  aspirin 81 MG chewable tablet Chew 81 mg by mouth every morning.    Historical Provider, MD  Cholecalciferol (VITAMIN D3) 2000 units capsule Take 2,000 Units by mouth daily.    Historical Provider, MD  Coenzyme Q10 (CO Q 10) 10 MG CAPS Take 10 mg by mouth daily.    Historical Provider, MD  docusate sodium (COLACE) 100 MG capsule Take 1 capsule (100 mg total) by  mouth 2 (two) times daily. 06/27/16 07/07/16  Lavonia Drafts, MD  fish oil-omega-3 fatty acids 1000 MG capsule Take 1 g by mouth daily.     Historical Provider, MD  HYDROcodone-acetaminophen (NORCO/VICODIN) 5-325 MG tablet Take 1 tablet by mouth every 4 (four) hours as needed for moderate pain. 06/27/16 06/27/17  Lavonia Drafts, MD  metoprolol tartrate (LOPRESSOR) 25 MG tablet Take 1 tablet (25 mg total) by mouth 2 (two) times daily. 03/31/16   Demetrios Loll, MD  Multiple Vitamin (MULTIVITAMIN) tablet Take 1 tablet by mouth daily.    Historical Provider, MD  simvastatin (ZOCOR) 20 MG tablet Take 10 mg by mouth every evening.     Historical Provider, MD  Tamsulosin HCl (FLOMAX) 0.4 MG CAPS Take 0.4 mg by mouth daily. Take 30 minutes after same meal every day.    Historical Provider, MD  traMADol (ULTRAM) 50 MG tablet Take 1 tablet (50 mg total) by mouth every 8 (eight) hours as needed for pain. Patient taking differently: Take 100 mg by mouth 3 (three) times daily as needed for moderate pain or severe pain.  01/16/13   Charlett Blake, MD  vitamin B-12 (CYANOCOBALAMIN) 1000 MCG tablet Take 1,000 mcg by mouth daily.    Historical Provider, MD     Allergies Fentanyl  Family History  Problem Relation Age of Onset  . Dementia Mother   .  Colon cancer Father     Social History Social History  Substance Use Topics  . Smoking status: Never Smoker  . Smokeless tobacco: Former Systems developer    Quit date: 11/22/1966  . Alcohol use No    Review of Systems  Constitutional: No fever/chills   Cardiovascular: Denies chest pain. Respiratory: Denies shortness of breath. Gastrointestinal: As above Genitourinary: Negative for hematuria Musculoskeletal: Negative for back pain.  Neurological: Negative for headaches  10-point ROS otherwise negative.  ____________________________________________   PHYSICAL EXAM:  VITAL SIGNS: ED Triage Vitals  Enc Vitals Group     BP 06/27/16 1012 (!) 143/63     Pulse Rate  06/27/16 1012 63     Resp 06/27/16 1012 16     Temp 06/27/16 1012 98.4 F (36.9 C)     Temp src --      SpO2 06/27/16 1012 94 %     Weight 06/27/16 1012 186 lb (84.4 kg)     Height 06/27/16 1012 5\' 8"  (1.727 m)     Head Circumference --      Peak Flow --      Pain Score 06/27/16 1013 8     Pain Loc --      Pain Edu? --      Excl. in [place]? --     Constitutional: Alert and oriented. No acute distress. Pleasant and interactive Eyes: Conjunctivae are normal.  Head: Atraumatic. Nose: No congestion/rhinnorhea. Mouth/Throat: Mucous membranes are moist.   Neck:  Painless ROM Cardiovascular: Normal rate, regular rhythm. Grossly normal heart sounds.  Good peripheral circulation. Respiratory: Normal respiratory effort.  No retractions. Lungs CTAB. Gastrointestinal: Tenderness to palpation left lower quadrant. No distention.  No CVA tenderness. Genitourinary: deferred Musculoskeletal: No lower extremity tenderness nor edema.  Warm and well perfused Neurologic:  Normal speech and language. No gross focal neurologic deficits are appreciated.  Skin:  Skin is warm, dry and intact. No rash noted. Psychiatric: Mood and affect are normal. Speech and behavior are normal.  ____________________________________________   LABS (all labs ordered are listed, but only abnormal results are displayed)  Labs Reviewed  CBC WITH DIFFERENTIAL/PLATELET - Abnormal; Notable for the following:       Result Value   WBC 10.8 (*)    Eosinophils Absolute 1.5 (*)    All other components within normal limits  COMPREHENSIVE METABOLIC PANEL - Abnormal; Notable for the following:    Glucose, Bld 114 (*)    BUN 23 (*)    Creatinine, Ser 1.42 (*)    GFR calc non Af Amer 46 (*)    GFR calc Af Amer 54 (*)    All other components within normal limits  URINALYSIS COMPLETEWITH MICROSCOPIC ([facility] ONLY) - Abnormal; Notable for the following:    Color, Urine YELLOW (*)    APPearance CLEAR (*)    Squamous Epithelial / LPF  0-5 (*)    All other components within normal limits  LIPASE, BLOOD   ____________________________________________  EKG  None ____________________________________________  RADIOLOGY  CT consistent with epiploic appendagitis. 3 cmmass noted and discussed with patient and wife ____________________________________________   PROCEDURES  Procedure(s) performed: No    Critical Care performed: No ____________________________________________   INITIAL IMPRESSION / ASSESSMENT AND PLAN / ED COURSE  Pertinent labs & imaging results that were available during my care of the patient were reviewed by me and considered in my medical decision making (see chart for details).  Patient's presentation is suspicious for diverticulitis. We will check labs, urine  and reevaluate.  Clinical Course  Value Comment By Time  MCV: 91.4 (Reviewed) Lavonia Drafts, MD 08/06 1124  Albumin: 4.2 (Reviewed) Lavonia Drafts, MD 08/06 1130  Nitrite: NEGATIVE (Reviewed) Lavonia Drafts, MD 08/06 1223  MCV: 91.4 (Reviewed) Lavonia Drafts, MD 08/06 1228    ----------------------------------------- 1:51 PM on 06/27/2016 -----------------------------------------  CT scan is consistent with epiploic appendagitis. Conservative management with pain medication and rest. Discussed with patient the need for follow-up PETCT scan to evaluate 3 cm mass noted by radiologist. Patient has follow-up already scheduled this week ____________________________________________   FINAL CLINICAL IMPRESSION(S) / ED DIAGNOSES  Final diagnoses:  Epiploic appendagitis      NEW MEDICATIONS STARTED DURING THIS VISIT:  New Prescriptions   DOCUSATE SODIUM (COLACE) 100 MG CAPSULE    Take 1 capsule (100 mg total) by mouth 2 (two) times daily.   HYDROCODONE-ACETAMINOPHEN (NORCO/VICODIN) 5-325 MG TABLET    Take 1 tablet by mouth every 4 (four) hours as needed for moderate pain.     Note:  This document was prepared using Dragon  voice recognition software and may include unintentional dictation errors.    Lavonia Drafts, MD 06/27/16 1352

## 2016-06-27 NOTE — ED Triage Notes (Signed)
C/O LLQ abdominal pain.  Onset of symptoms Tuesday or Wednesday.  Last BM 3 days ago.  Denies N/V

## 2016-06-27 NOTE — Discharge Instructions (Signed)
Please take tylenol for pain. If needed, take the prescription pain medication but be aware it can worsen constipation. Please follow up with Dr. Sabra Heck as scheduled. You need a follow up PET-CT scan to evaluate the incidental mass found on our CT scan.

## 2016-06-30 DIAGNOSIS — I10 Essential (primary) hypertension: Secondary | ICD-10-CM | POA: Diagnosis not present

## 2016-06-30 DIAGNOSIS — N183 Chronic kidney disease, stage 3 (moderate): Secondary | ICD-10-CM | POA: Diagnosis not present

## 2016-06-30 DIAGNOSIS — E782 Mixed hyperlipidemia: Secondary | ICD-10-CM | POA: Diagnosis not present

## 2016-06-30 DIAGNOSIS — Z125 Encounter for screening for malignant neoplasm of prostate: Secondary | ICD-10-CM | POA: Diagnosis not present

## 2016-07-01 ENCOUNTER — Other Ambulatory Visit: Payer: Self-pay | Admitting: Internal Medicine

## 2016-07-01 DIAGNOSIS — R002 Palpitations: Secondary | ICD-10-CM | POA: Diagnosis not present

## 2016-07-01 DIAGNOSIS — R9389 Abnormal findings on diagnostic imaging of other specified body structures: Secondary | ICD-10-CM

## 2016-07-01 DIAGNOSIS — I700 Atherosclerosis of aorta: Secondary | ICD-10-CM | POA: Diagnosis not present

## 2016-07-01 DIAGNOSIS — R1012 Left upper quadrant pain: Secondary | ICD-10-CM | POA: Diagnosis not present

## 2016-07-07 ENCOUNTER — Ambulatory Visit
Admission: RE | Admit: 2016-07-07 | Discharge: 2016-07-07 | Disposition: A | Discharge status: Home / Self Care | Payer: PPO | Source: Ambulatory Visit | Attending: Internal Medicine | Admitting: Internal Medicine

## 2016-07-07 DIAGNOSIS — R1084 Generalized abdominal pain: Secondary | ICD-10-CM | POA: Insufficient documentation

## 2016-07-07 DIAGNOSIS — I2510 Atherosclerotic heart disease of native coronary artery without angina pectoris: Secondary | ICD-10-CM | POA: Insufficient documentation

## 2016-07-07 DIAGNOSIS — R590 Localized enlarged lymph nodes: Secondary | ICD-10-CM | POA: Insufficient documentation

## 2016-07-07 DIAGNOSIS — R1012 Left upper quadrant pain: Secondary | ICD-10-CM

## 2016-07-07 DIAGNOSIS — I700 Atherosclerosis of aorta: Secondary | ICD-10-CM | POA: Diagnosis not present

## 2016-07-07 DIAGNOSIS — Z0000 Encounter for general adult medical examination without abnormal findings: Secondary | ICD-10-CM | POA: Diagnosis not present

## 2016-07-07 DIAGNOSIS — R9389 Abnormal findings on diagnostic imaging of other specified body structures: Secondary | ICD-10-CM

## 2016-07-07 DIAGNOSIS — K639 Disease of intestine, unspecified: Secondary | ICD-10-CM | POA: Diagnosis not present

## 2016-07-07 DIAGNOSIS — R1902 Left upper quadrant abdominal swelling, mass and lump: Secondary | ICD-10-CM | POA: Diagnosis not present

## 2016-07-07 LAB — GLUCOSE, CAPILLARY: GLUCOSE-CAPILLARY: 98 mg/dL (ref 65–99)

## 2016-07-07 MED ORDER — FLUDEOXYGLUCOSE F - 18 (FDG) INJECTION
12.0000 | Freq: Once | INTRAVENOUS | Status: AC | PRN
  Administered 2016-07-07: 11.36 via INTRAVENOUS

## 2016-07-08 ENCOUNTER — Other Ambulatory Visit: Payer: Self-pay | Admitting: Internal Medicine

## 2016-07-08 DIAGNOSIS — R1084 Generalized abdominal pain: Secondary | ICD-10-CM

## 2016-07-08 DIAGNOSIS — K639 Disease of intestine, unspecified: Secondary | ICD-10-CM

## 2016-08-13 DIAGNOSIS — Z800 Family history of malignant neoplasm of digestive organs: Secondary | ICD-10-CM | POA: Diagnosis not present

## 2016-08-13 DIAGNOSIS — R1902 Left upper quadrant abdominal swelling, mass and lump: Secondary | ICD-10-CM | POA: Diagnosis not present

## 2016-08-13 DIAGNOSIS — Z86010 Personal history of colonic polyps: Secondary | ICD-10-CM | POA: Diagnosis not present

## 2016-09-07 DIAGNOSIS — N183 Chronic kidney disease, stage 3 (moderate): Secondary | ICD-10-CM | POA: Diagnosis not present

## 2016-09-07 DIAGNOSIS — I10 Essential (primary) hypertension: Secondary | ICD-10-CM | POA: Diagnosis not present

## 2016-09-07 DIAGNOSIS — M5442 Lumbago with sciatica, left side: Secondary | ICD-10-CM | POA: Diagnosis not present

## 2016-10-08 ENCOUNTER — Ambulatory Visit
Admission: RE | Admit: 2016-10-08 | Discharge: 2016-10-08 | Disposition: A | Discharge status: Home / Self Care | Payer: PPO | Source: Ambulatory Visit | Attending: Internal Medicine | Admitting: Internal Medicine

## 2016-10-08 ENCOUNTER — Encounter (INDEPENDENT_AMBULATORY_CARE_PROVIDER_SITE_OTHER): Payer: Self-pay

## 2016-10-08 ENCOUNTER — Other Ambulatory Visit
Admission: RE | Admit: 2016-10-08 | Discharge: 2016-10-08 | Disposition: A | Discharge status: Home / Self Care | Payer: PPO | Source: Ambulatory Visit | Attending: Internal Medicine | Admitting: Internal Medicine

## 2016-10-08 DIAGNOSIS — K5730 Diverticulosis of large intestine without perforation or abscess without bleeding: Secondary | ICD-10-CM | POA: Insufficient documentation

## 2016-10-08 DIAGNOSIS — R1909 Other intra-abdominal and pelvic swelling, mass and lump: Secondary | ICD-10-CM | POA: Diagnosis not present

## 2016-10-08 DIAGNOSIS — R1084 Generalized abdominal pain: Secondary | ICD-10-CM

## 2016-10-08 DIAGNOSIS — I700 Atherosclerosis of aorta: Secondary | ICD-10-CM | POA: Diagnosis not present

## 2016-10-08 DIAGNOSIS — K639 Disease of intestine, unspecified: Secondary | ICD-10-CM

## 2016-10-08 LAB — CREATININE, SERUM
Creatinine, Ser: 1.62 mg/dL — ABNORMAL HIGH (ref 0.61–1.24)
GFR calc non Af Amer: 40 mL/min — ABNORMAL LOW (ref >60)
GFR, EST AFRICAN AMERICAN: 46 mL/min — AB (ref >60)

## 2016-10-08 MED ORDER — IOPAMIDOL (ISOVUE-300) INJECTION 61%
80.0000 mL | Freq: Once | INTRAVENOUS | Status: AC | PRN
  Administered 2016-10-08: 80 mL via INTRAVENOUS

## 2016-10-11 ENCOUNTER — Other Ambulatory Visit: Payer: Self-pay | Admitting: Internal Medicine

## 2016-10-11 DIAGNOSIS — R1900 Intra-abdominal and pelvic swelling, mass and lump, unspecified site: Secondary | ICD-10-CM

## 2016-10-11 DIAGNOSIS — R1084 Generalized abdominal pain: Secondary | ICD-10-CM

## 2016-10-29 ENCOUNTER — Encounter: Payer: Self-pay | Admitting: *Deleted

## 2016-11-01 ENCOUNTER — Encounter
Admission: RE | Disposition: A | Discharge status: Home / Self Care | Payer: Self-pay | Source: Ambulatory Visit | Attending: Unknown Physician Specialty

## 2016-11-01 ENCOUNTER — Ambulatory Visit
Admission: RE | Admit: 2016-11-01 | Discharge: 2016-11-01 | Disposition: A | Discharge status: Home / Self Care | Payer: PPO | Source: Ambulatory Visit | Attending: Unknown Physician Specialty | Admitting: Unknown Physician Specialty

## 2016-11-01 ENCOUNTER — Ambulatory Visit: Payer: PPO | Admitting: Anesthesiology

## 2016-11-01 DIAGNOSIS — K5790 Diverticulosis of intestine, part unspecified, without perforation or abscess without bleeding: Secondary | ICD-10-CM | POA: Diagnosis not present

## 2016-11-01 DIAGNOSIS — K2970 Gastritis, unspecified, without bleeding: Secondary | ICD-10-CM | POA: Diagnosis not present

## 2016-11-01 DIAGNOSIS — Z7982 Long term (current) use of aspirin: Secondary | ICD-10-CM | POA: Diagnosis not present

## 2016-11-01 DIAGNOSIS — E785 Hyperlipidemia, unspecified: Secondary | ICD-10-CM | POA: Insufficient documentation

## 2016-11-01 DIAGNOSIS — K648 Other hemorrhoids: Secondary | ICD-10-CM | POA: Diagnosis not present

## 2016-11-01 DIAGNOSIS — I129 Hypertensive chronic kidney disease with stage 1 through stage 4 chronic kidney disease, or unspecified chronic kidney disease: Secondary | ICD-10-CM | POA: Diagnosis not present

## 2016-11-01 DIAGNOSIS — Z87891 Personal history of nicotine dependence: Secondary | ICD-10-CM | POA: Insufficient documentation

## 2016-11-01 DIAGNOSIS — K2900 Acute gastritis without bleeding: Secondary | ICD-10-CM | POA: Diagnosis not present

## 2016-11-01 DIAGNOSIS — B9681 Helicobacter pylori [H. pylori] as the cause of diseases classified elsewhere: Secondary | ICD-10-CM | POA: Diagnosis not present

## 2016-11-01 DIAGNOSIS — D125 Benign neoplasm of sigmoid colon: Secondary | ICD-10-CM | POA: Insufficient documentation

## 2016-11-01 DIAGNOSIS — M1611 Unilateral primary osteoarthritis, right hip: Secondary | ICD-10-CM | POA: Diagnosis not present

## 2016-11-01 DIAGNOSIS — Z1211 Encounter for screening for malignant neoplasm of colon: Secondary | ICD-10-CM | POA: Insufficient documentation

## 2016-11-01 DIAGNOSIS — K2960 Other gastritis without bleeding: Secondary | ICD-10-CM | POA: Diagnosis not present

## 2016-11-01 DIAGNOSIS — F329 Major depressive disorder, single episode, unspecified: Secondary | ICD-10-CM | POA: Diagnosis not present

## 2016-11-01 DIAGNOSIS — K317 Polyp of stomach and duodenum: Secondary | ICD-10-CM | POA: Diagnosis not present

## 2016-11-01 DIAGNOSIS — Z86010 Personal history of colonic polyps: Secondary | ICD-10-CM | POA: Insufficient documentation

## 2016-11-01 DIAGNOSIS — K640 First degree hemorrhoids: Secondary | ICD-10-CM | POA: Diagnosis not present

## 2016-11-01 DIAGNOSIS — N189 Chronic kidney disease, unspecified: Secondary | ICD-10-CM | POA: Diagnosis not present

## 2016-11-01 DIAGNOSIS — K5730 Diverticulosis of large intestine without perforation or abscess without bleeding: Secondary | ICD-10-CM | POA: Insufficient documentation

## 2016-11-01 DIAGNOSIS — K635 Polyp of colon: Secondary | ICD-10-CM | POA: Diagnosis not present

## 2016-11-01 HISTORY — DX: Atherosclerosis of aorta: I70.0

## 2016-11-01 HISTORY — PX: ESOPHAGOGASTRODUODENOSCOPY (EGD) WITH PROPOFOL: SHX5813

## 2016-11-01 HISTORY — DX: Unspecified osteoarthritis, unspecified site: M19.90

## 2016-11-01 HISTORY — PX: COLONOSCOPY WITH PROPOFOL: SHX5780

## 2016-11-01 HISTORY — DX: Diverticulosis of intestine, part unspecified, without perforation or abscess without bleeding: K57.90

## 2016-11-01 HISTORY — DX: Personal history of other diseases of the digestive system: Z87.19

## 2016-11-01 SURGERY — COLONOSCOPY WITH PROPOFOL
Anesthesia: General

## 2016-11-01 MED ORDER — SODIUM CHLORIDE 0.9 % IV SOLN
INTRAVENOUS | Status: DC
  Administered 2016-11-01: 09:00:00 via INTRAVENOUS

## 2016-11-01 MED ORDER — METOPROLOL TARTRATE 25 MG PO TABS
25.0000 mg | ORAL_TABLET | ORAL | Status: AC
  Administered 2016-11-01: 25 mg via ORAL

## 2016-11-01 MED ORDER — EPHEDRINE SULFATE 50 MG/ML IJ SOLN
INTRAMUSCULAR | Status: DC | PRN
  Administered 2016-11-01: 15 mg via INTRAVENOUS
  Administered 2016-11-01: 10 mg via INTRAVENOUS
  Administered 2016-11-01: 15 mg via INTRAVENOUS
  Administered 2016-11-01: 10 mg via INTRAVENOUS

## 2016-11-01 MED ORDER — SODIUM CHLORIDE 0.9 % IV SOLN: INTRAVENOUS | Status: DC

## 2016-11-01 MED ORDER — METOPROLOL TARTRATE 25 MG PO TABS
ORAL_TABLET | ORAL | Status: AC
  Filled 2016-11-01: qty 1

## 2016-11-01 MED ORDER — PROPOFOL 500 MG/50ML IV EMUL
INTRAVENOUS | Status: DC | PRN
  Administered 2016-11-01: 125 ug/kg/min via INTRAVENOUS

## 2016-11-01 MED ORDER — PROPOFOL 10 MG/ML IV BOLUS
INTRAVENOUS | Status: DC | PRN
  Administered 2016-11-01: 20 mg via INTRAVENOUS
  Administered 2016-11-01: 50 mg via INTRAVENOUS

## 2016-11-01 NOTE — Anesthesia Preprocedure Evaluation (Signed)
Anesthesia Evaluation  Patient identified by MRN, date of birth, ID band Patient awake    Reviewed: Allergy & Precautions, NPO status , Patient's Chart, lab work & pertinent test results  History of Anesthesia Complications Negative for: history of anesthetic complications  Airway Mallampati: II       Dental   Pulmonary neg pulmonary ROS, former smoker,           Cardiovascular hypertension, Pt. on medications and Pt. on home beta blockers      Neuro/Psych Depression negative neurological ROS     GI/Hepatic negative GI ROS, Neg liver ROS,   Endo/Other  negative endocrine ROS  Renal/GU Renal InsufficiencyRenal disease     Musculoskeletal  (+) Arthritis ,   Abdominal   Peds  Hematology   Anesthesia Other Findings   Reproductive/Obstetrics                             Anesthesia Physical Anesthesia Plan  ASA: II  Anesthesia Plan: General   Post-op Pain Management:    Induction: Intravenous  Airway Management Planned: Nasal Cannula  Additional Equipment:   Intra-op Plan:   Post-operative Plan:   Informed Consent: I have reviewed the patients History and Physical, chart, labs and discussed the procedure including the risks, benefits and alternatives for the proposed anesthesia with the patient or authorized representative who has indicated his/her understanding and acceptance.     Plan Discussed with:   Anesthesia Plan Comments:         Anesthesia Quick Evaluation

## 2016-11-01 NOTE — Op Note (Signed)
[facility] Gastroenterology Patient Name: Shawn Stafford Procedure Date: 11/01/2016 9:06 AM MRN: [id number] Account #: [id number] Date of Birth: [birth year] Admit Type: Outpatient Age: 75 Room: [facility] ENDO ROOM 4 Gender: Male Note Status: Finalized Procedure:            Upper GI endoscopy Indications:          Abnormal CT of the GI tract Providers:            Manya Silvas, MD Referring MD:         Rusty Aus, MD (Referring MD) Medicines:            Propofol per Anesthesia Complications:        No immediate complications. Procedure:            Pre-Anesthesia Assessment:                       - After reviewing the risks and benefits, the patient                        was deemed in satisfactory condition to undergo the                        procedure.                       After obtaining informed consent, the endoscope was                        passed under direct vision. Throughout the procedure,                        the patient's blood pressure, pulse, and oxygen                        saturations were monitored continuously. The Endoscope                        was introduced through the mouth, and advanced to the                        second part of duodenum. The upper GI endoscopy was                        accomplished without difficulty. The patient tolerated                        the procedure well. Findings:      The examined esophagus was normal.      Diffuse and patchy mild inflammation characterized by erythema and       granularity was found in the gastric body and in the gastric antrum.       Biopsies were taken with a cold forceps for histology. Biopsies were       taken with a cold forceps for Helicobacter pylori testing.      A single 8 mm sessile polyp with no bleeding and no stigmata of recent       bleeding was found in the gastric antrum. Biopsies were taken with a       cold forceps for histology.      The examined duodenum was  normal.  The Z-line was irregular and was found 44 cm from the incisors. Impression:           - Normal esophagus.                       - Gastritis. Biopsied.                       - A single gastric polyp. Biopsied.                       - Normal examined duodenum. Recommendation:       - Await pathology results. Manya Silvas, MD 11/01/2016 9:32:19 AM This report has been signed electronically. Number of Addenda: 0 Note Initiated On: 11/01/2016 9:06 AM      [facility]

## 2016-11-01 NOTE — Anesthesia Postprocedure Evaluation (Signed)
Anesthesia Post Note  Patient: Shawn Stafford  Procedure(s) Performed: Procedure(s) (LRB): COLONOSCOPY WITH PROPOFOL (N/A) ESOPHAGOGASTRODUODENOSCOPY (EGD) WITH PROPOFOL (N/A)  Patient location during evaluation: Endoscopy Anesthesia Type: General Level of consciousness: awake and alert Pain management: pain level controlled Vital Signs Assessment: post-procedure vital signs reviewed and stable Respiratory status: spontaneous breathing and respiratory function stable Cardiovascular status: stable Anesthetic complications: no    Last Vitals:  Vitals:   11/01/16 0952 11/01/16 0953  BP: (!) 100/46 (!) 100/46  Pulse: 75 72  Resp: 15 16  Temp: 36.3 C     Last Pain:  Vitals:   11/01/16 0953  TempSrc: Tympanic  PainSc: Asleep                 Anntonette Madewell K

## 2016-11-01 NOTE — Op Note (Signed)
[facility] Gastroenterology Patient Name: Shawn Stafford Procedure Date: 11/01/2016 9:06 AM MRN: [id number] Account #: [id number] Date of Birth: [birth year] Admit Type: Outpatient Age: 75 Room: [facility] ENDO ROOM 4 Gender: Male Note Status: Finalized Procedure:            Colonoscopy Indications:          High risk colon cancer surveillance: Personal history                        of colonic polyps Providers:            Manya Silvas, MD Medicines:            Propofol per Anesthesia Complications:        No immediate complications. Procedure:            Pre-Anesthesia Assessment:                       - After reviewing the risks and benefits, the patient                        was deemed in satisfactory condition to undergo the                        procedure.                       After obtaining informed consent, the colonoscope was                        passed under direct vision. Throughout the procedure,                        the patient's blood pressure, pulse, and oxygen                        saturations were monitored continuously. The                        Colonoscope was introduced through the anus and                        advanced to the the cecum, identified by appendiceal                        orifice and ileocecal valve. The colonoscopy was                        performed without difficulty. The patient tolerated the                        procedure well. The quality of the bowel preparation                        was excellent. Findings:      A diminutive polyp was found in the sigmoid colon. The polyp was       sessile. The polyp was removed with a jumbo cold forceps. Resection and       retrieval were complete.      A single small-mouthed diverticulum was found in the proximal sigmoid       colon. There was no  evidence of diverticular bleeding.      Internal hemorrhoids were found during endoscopy. The hemorrhoids were       small and  Grade I (internal hemorrhoids that do not prolapse).      The exam was otherwise without abnormality. Impression:           - One diminutive polyp in the sigmoid colon, removed                        with a jumbo cold forceps. Resected and retrieved.                       - Diverticulosis in the proximal sigmoid colon. There                        was no evidence of diverticular bleeding.                       - Internal hemorrhoids.                       - The examination was otherwise normal. Recommendation:       - Await pathology results. Manya Silvas, MD 11/01/2016 9:50:09 AM This report has been signed electronically. Number of Addenda: 0 Note Initiated On: 11/01/2016 9:06 AM Scope Withdrawal Time: 0 hours 9 minutes 24 seconds  Total Procedure Duration: 0 hours 13 minutes 58 seconds       [facility]

## 2016-11-01 NOTE — Transfer of Care (Signed)
Immediate Anesthesia Transfer of Care Note  Patient: Shawn Stafford  Procedure(s) Performed: Procedure(s): COLONOSCOPY WITH PROPOFOL (N/A) ESOPHAGOGASTRODUODENOSCOPY (EGD) WITH PROPOFOL (N/A)  Patient Location: PACU  Anesthesia Type:General  Level of Consciousness: patient cooperative and responds to stimulation  Airway & Oxygen Therapy: Patient Spontanous Breathing and Patient connected to nasal cannula oxygen  Post-op Assessment: Report given to RN and Post -op Vital signs reviewed and stable  Post vital signs: Reviewed and stable  Last Vitals:  Vitals:   11/01/16 0952 11/01/16 0953  BP: (!) 100/46 (!) 100/46  Pulse: 75 72  Resp: 15 16  Temp: 36.3 C     Last Pain:  Vitals:   11/01/16 0953  TempSrc: Tympanic  PainSc: Asleep         Complications: No apparent anesthesia complications

## 2016-11-01 NOTE — H&P (Signed)
Primary Care Physician:  Rusty Aus, MD Primary Gastroenterologist:  Dr. Vira Agar  Pre-Procedure History & Physical: HPI:  Shawn Stafford is a 75 y.o. male is here for an endoscopy and colonoscopy.   Past Medical History:  Diagnosis Date  . Aortic atherosclerosis ([place])   . Arthritis    OSTEOARTHRITIS RIGHT HIP  . CKD (chronic kidney disease)   . Compression fracture   . Depression   . Diverticulosis   . History of pancreatitis   . Hyperlipidemia   . Hypertension   . Prostate disorder     Past Surgical History:  Procedure Laterality Date  . CATARACT EXTRACTION    . HERNIA REPAIR    . KYPHOPLASTY    . SPINE SURGERY      Prior to Admission medications   Medication Sig Start Date End Date Taking? Authorizing Provider  fexofenadine-pseudoephedrine (ALLEGRA-D) 60-120 MG 12 hr tablet Take 1 tablet by mouth 2 (two) times daily.   Yes Historical Provider, MD  metoprolol tartrate (LOPRESSOR) 25 MG tablet Take 1 tablet (25 mg total) by mouth 2 (two) times daily. 03/31/16  Yes Demetrios Loll, MD  niacin 500 MG tablet Take 1,500 mg by mouth at bedtime.   Yes Historical Provider, MD  acetaminophen (TYLENOL) 500 MG tablet Take 1,000 mg by mouth 3 (three) times daily as needed for mild pain or moderate pain.    Historical Provider, MD  amLODipine (NORVASC) 5 MG tablet Take 5 mg by mouth daily. 07/04/15   Historical Provider, MD  aspirin 81 MG chewable tablet Chew 81 mg by mouth every morning.    Historical Provider, MD  Cholecalciferol (VITAMIN D3) 2000 units capsule Take 2,000 Units by mouth daily.    Historical Provider, MD  Coenzyme Q10 (CO Q 10) 10 MG CAPS Take 10 mg by mouth daily.    Historical Provider, MD  fish oil-omega-3 fatty acids 1000 MG capsule Take 1 g by mouth daily.     Historical Provider, MD  HYDROcodone-acetaminophen (NORCO/VICODIN) 5-325 MG tablet Take 1 tablet by mouth every 4 (four) hours as needed for moderate pain. 06/27/16 06/27/17  Lavonia Drafts, MD  Multiple Vitamin  (MULTIVITAMIN) tablet Take 1 tablet by mouth daily.    Historical Provider, MD  simvastatin (ZOCOR) 20 MG tablet Take 10 mg by mouth every evening.     Historical Provider, MD  Tamsulosin HCl (FLOMAX) 0.4 MG CAPS Take 0.4 mg by mouth daily. Take 30 minutes after same meal every day.    Historical Provider, MD  traMADol (ULTRAM) 50 MG tablet Take 1 tablet (50 mg total) by mouth every 8 (eight) hours as needed for pain. Patient taking differently: Take 100 mg by mouth 3 (three) times daily as needed for moderate pain or severe pain.  01/16/13   Charlett Blake, MD  vitamin B-12 (CYANOCOBALAMIN) 1000 MCG tablet Take 1,000 mcg by mouth daily.    Historical Provider, MD    Allergies as of 10/13/2016 - Review Complete 07/07/2016  Allergen Reaction Noted  . Fentanyl  03/28/2016    Family History  Problem Relation Age of Onset  . Dementia Mother   . Colon cancer Father     Social History   Social History  . Marital status: Married    Spouse name: N/A  . Number of children: N/A  . Years of education: N/A   Occupational History  . Not on file.   Social History Main Topics  . Smoking status: Former Smoker  Quit date: 11/23/1967  . Smokeless tobacco: Never Used  . Alcohol use No  . Drug use: No  . Sexual activity: Not on file   Other Topics Concern  . Not on file   Social History Narrative   Lives at home with wife.    Review of Systems: See HPI, otherwise negative ROS  Physical Exam: BP 125/65   Pulse (!) 58   Temp (!) 96.9 F (36.1 C) (Tympanic)   Resp 17   Ht 5\' 9"  (1.753 m)   Wt 81.6 kg (180 lb)   SpO2 98%   BMI 26.58 kg/m  General:   Alert,  pleasant and cooperative in NAD Head:  Normocephalic and atraumatic. Neck:  Supple; no masses or thyromegaly. Lungs:  Clear throughout to auscultation.    Heart:  Regular rate and rhythm. Abdomen:  Soft, nontender and nondistended. Normal bowel sounds, without guarding, and without rebound.   Neurologic:  Alert and   oriented x4;  grossly normal neurologically.  Impression/Plan: Shawn Stafford is here for an endoscopy and colonoscopy to be performed for abdominal mass in LUQ, PH colon polyps  Risks, benefits, limitations, and alternatives regarding  endoscopy and colonoscopy have been reviewed with the patient.  Questions have been answered.  All parties agreeable.   Gaylyn Cheers, MD  11/01/2016, 9:14 AM

## 2016-11-02 ENCOUNTER — Encounter: Payer: Self-pay | Admitting: Unknown Physician Specialty

## 2016-11-02 LAB — SURGICAL PATHOLOGY

## 2016-12-16 DIAGNOSIS — K635 Polyp of colon: Secondary | ICD-10-CM | POA: Diagnosis not present

## 2016-12-16 DIAGNOSIS — Z800 Family history of malignant neoplasm of digestive organs: Secondary | ICD-10-CM | POA: Diagnosis not present

## 2016-12-16 DIAGNOSIS — K2970 Gastritis, unspecified, without bleeding: Secondary | ICD-10-CM | POA: Diagnosis not present

## 2016-12-16 DIAGNOSIS — A048 Other specified bacterial intestinal infections: Secondary | ICD-10-CM | POA: Diagnosis not present

## 2016-12-16 DIAGNOSIS — B9681 Helicobacter pylori [H. pylori] as the cause of diseases classified elsewhere: Secondary | ICD-10-CM | POA: Diagnosis not present

## 2016-12-16 DIAGNOSIS — R1902 Left upper quadrant abdominal swelling, mass and lump: Secondary | ICD-10-CM | POA: Diagnosis not present

## 2017-01-03 DIAGNOSIS — Z0000 Encounter for general adult medical examination without abnormal findings: Secondary | ICD-10-CM | POA: Diagnosis not present

## 2017-01-10 DIAGNOSIS — I10 Essential (primary) hypertension: Secondary | ICD-10-CM | POA: Diagnosis not present

## 2017-01-10 DIAGNOSIS — K2900 Acute gastritis without bleeding: Secondary | ICD-10-CM | POA: Diagnosis not present

## 2017-01-10 DIAGNOSIS — I700 Atherosclerosis of aorta: Secondary | ICD-10-CM | POA: Diagnosis not present

## 2017-01-10 DIAGNOSIS — Z0000 Encounter for general adult medical examination without abnormal findings: Secondary | ICD-10-CM | POA: Diagnosis not present

## 2017-01-10 DIAGNOSIS — N183 Chronic kidney disease, stage 3 (moderate): Secondary | ICD-10-CM | POA: Diagnosis not present

## 2017-02-01 DIAGNOSIS — L309 Dermatitis, unspecified: Secondary | ICD-10-CM | POA: Diagnosis not present

## 2017-02-17 DIAGNOSIS — A048 Other specified bacterial intestinal infections: Secondary | ICD-10-CM | POA: Diagnosis not present

## 2017-03-18 DIAGNOSIS — R079 Chest pain, unspecified: Secondary | ICD-10-CM | POA: Diagnosis not present

## 2017-03-18 DIAGNOSIS — M5414 Radiculopathy, thoracic region: Secondary | ICD-10-CM | POA: Diagnosis not present

## 2017-03-28 IMAGING — CT CT ABD-PELV W/O CM
1 of 2 series · 15 of 32 positions shown, 19 images · non-contrast
Comparison: None.

CLINICAL DATA: Upper abdominal pain since [DATE] this morning.
Nausea and vomiting. Elevated lipase.

EXAM:
CT ABDOMEN AND PELVIS WITHOUT CONTRAST
TECHNIQUE: Multidetector CT imaging of the abdomen and pelvis was performed
following the standard protocol without IV contrast.

[Series 2: routine abd pel without · axial · non-contrast · 0.75mm/px · z∈[-882,-442]mm · 15 of 96 slices shown, 19 images]
[im 4/96  soft-tissue]
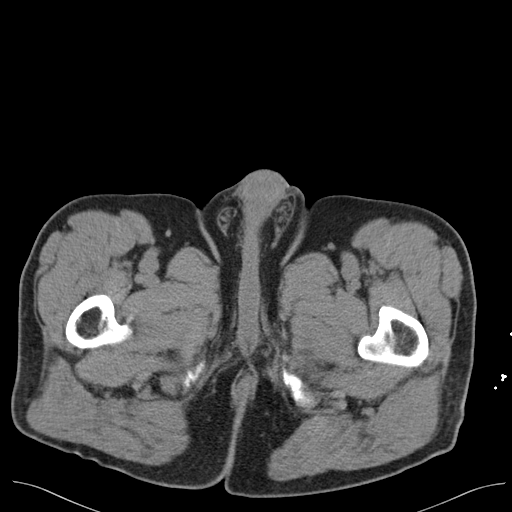
[im 4/96  bone]
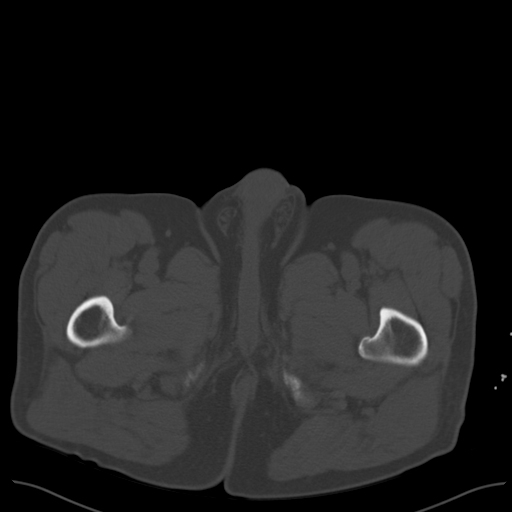
[im 12/96  soft-tissue]
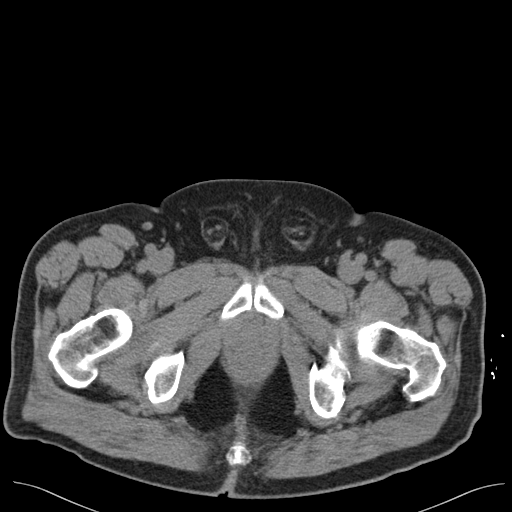
[im 20/96  soft-tissue]
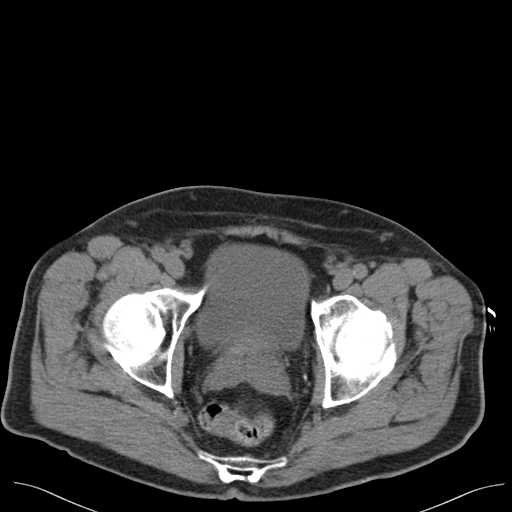
[im 28/96  soft-tissue]
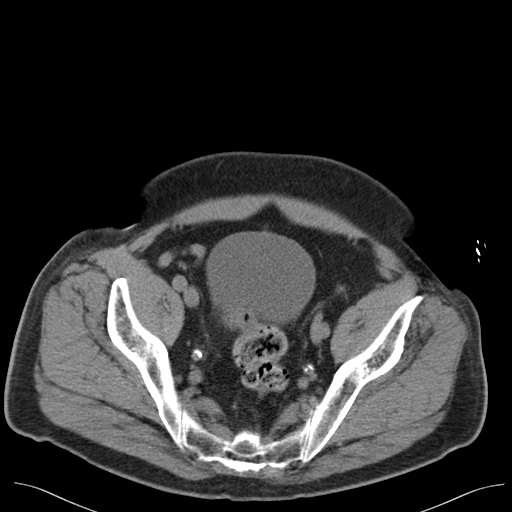
[im 32/96  soft-tissue]
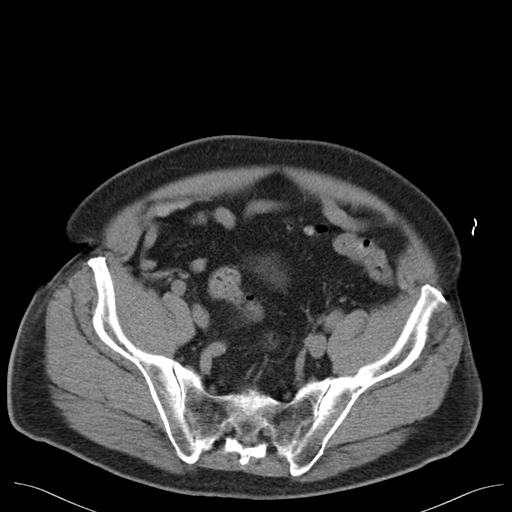
[im 40/96  soft-tissue]
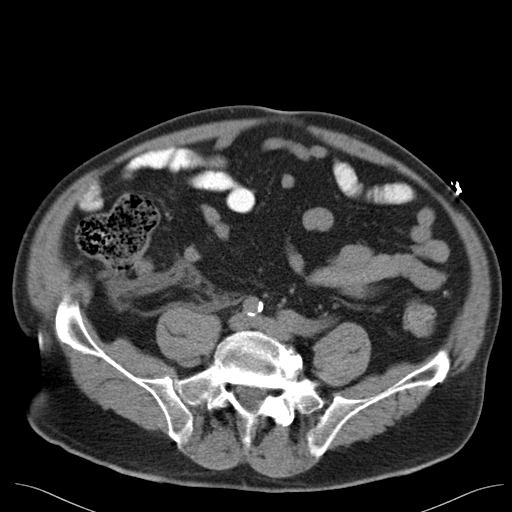
[im 48/96  soft-tissue]
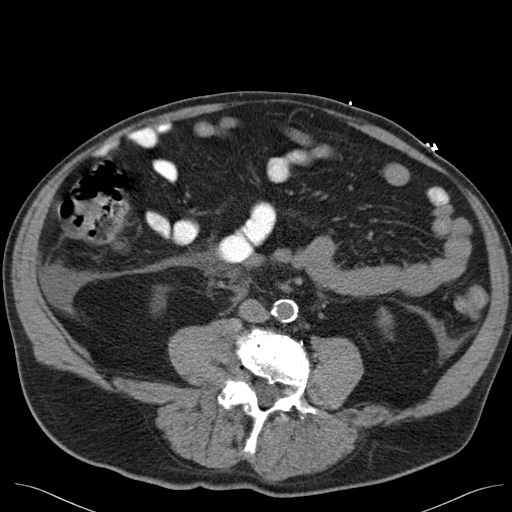
[im 56/96  soft-tissue]
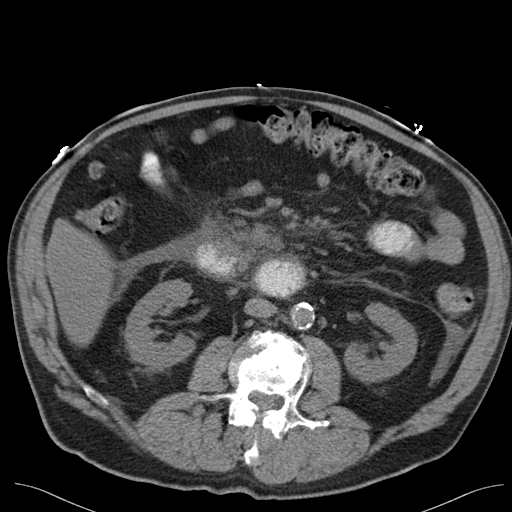
[im 64/96  soft-tissue]
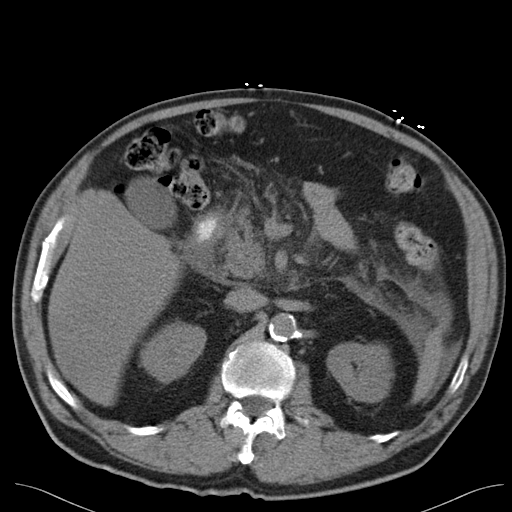
[im 64/96  bone]
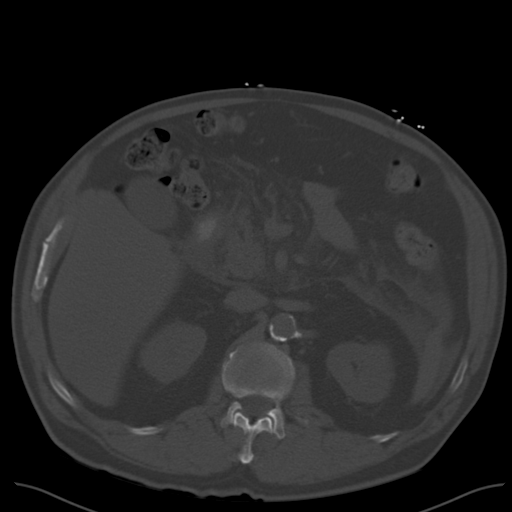
[im 68/96  soft-tissue]
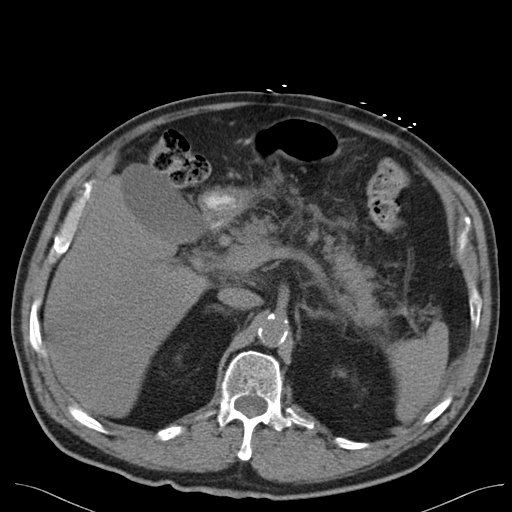
[im 76/96  soft-tissue]
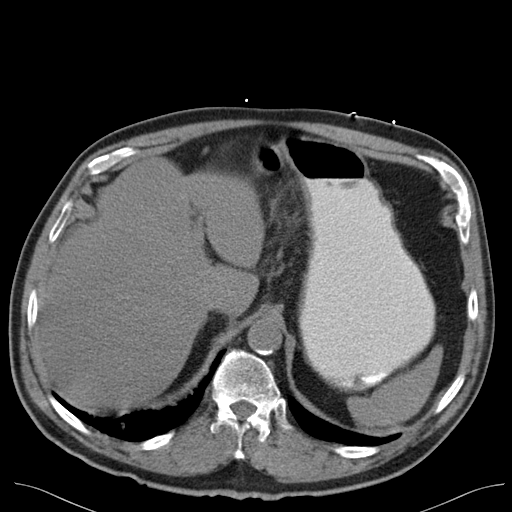
[im 80/96  lung]
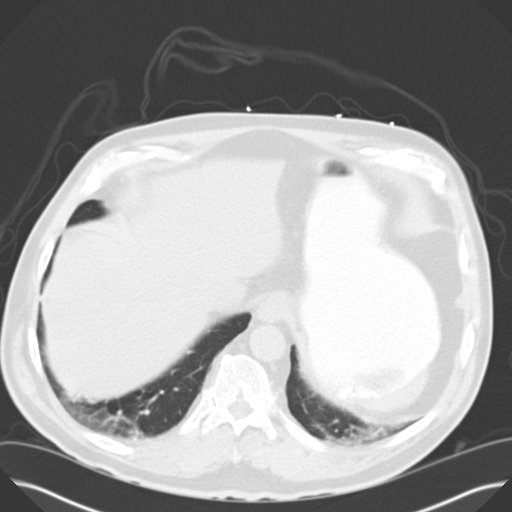
[im 84/96  soft-tissue]
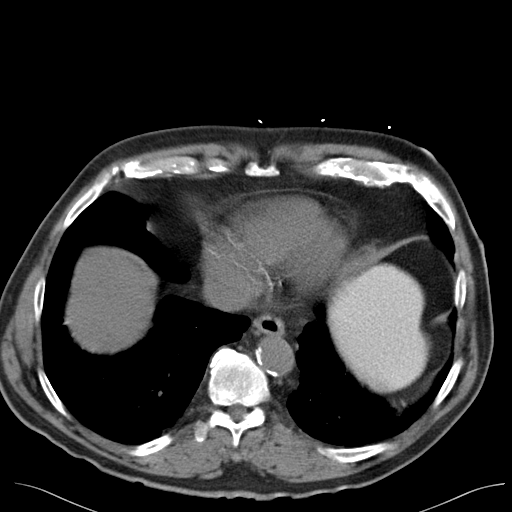
[im 84/96  lung]
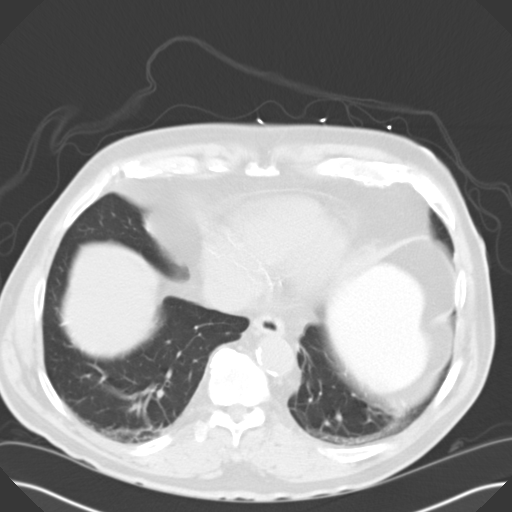
[im 88/96  lung]
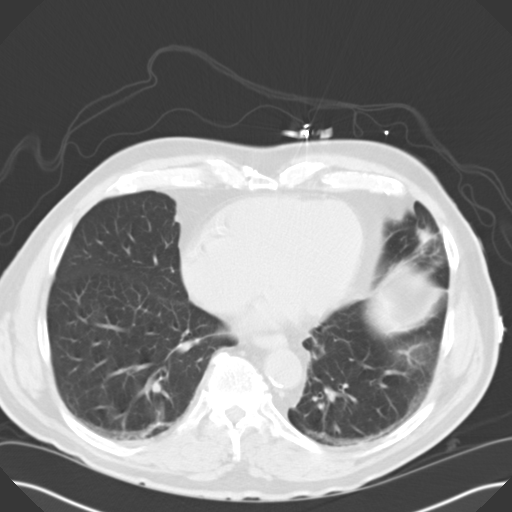
[im 92/96  soft-tissue]
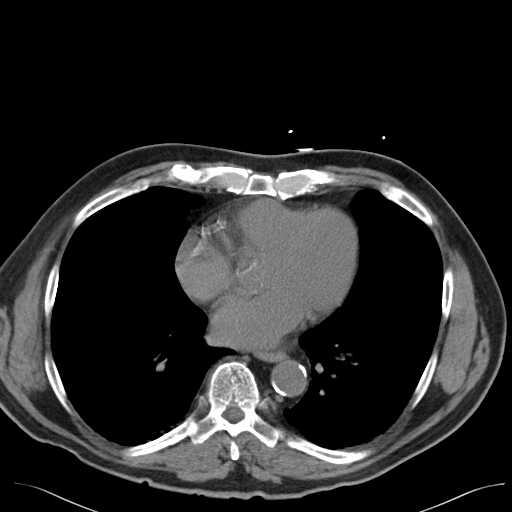
[im 92/96  lung]
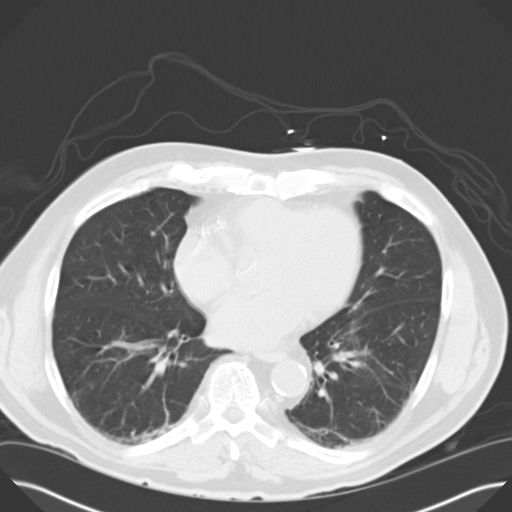

[15 of 32 positions shown; findings below may reference images not displayed]

FINDINGS: Lower chest: Dependent subpleural atelectasis but no infiltrates or
effusions. The heart is normal in size. Extensive coronary artery
calcifications. The distal esophagus is grossly normal.

Hepatobiliary: No focal hepatic lesions or intrahepatic biliary
dilatation. The gallbladder is unremarkable. No common bile duct
dilatation.

Pancreas: Extensive inflammation above and surrounding the pancreas
consistent with acute pancreatitis. There is also inflammation of
the second and third portions of the duodenum. Small amount of fluid
noted in the anterior pararenal space on both sides. No pancreatic
calcifications. No obvious common bile duct stone.

Spleen: Normal size.  No focal lesions.

Adrenals/Urinary Tract: The adrenal glands and kidneys are
unremarkable. No renal or obstructing ureteral calculi. No
hydronephrosis.

Stomach/Bowel: The stomach is unremarkable. There is inflammation
surrounding the second and third portions of the duodenum. The small
bowel and colon are unremarkable. No obstructive findings or mass
lesions. The terminal ileum is normal. The appendix is normal.

Vascular/Lymphatic: No mesenteric or retroperitoneal mass or
adenopathy. Small scattered lymph nodes are noted. There are dense
atherosclerotic calcifications involving the aorta and branch vessel
ostia. No aneurysm.

Other: The prostate gland is mildly enlarged. The seminal vesicles
appear normal. No pelvic mass or adenopathy. No free pelvic fluid
collections. No inguinal mass or adenopathy.

Musculoskeletal: No significant bony findings. Vertebral
augmentation changes noted at L3 and L4.
IMPRESSION: 1. CT findings consistent with acute pancreatitis.
2. Advanced atherosclerotic calcifications involving the aorta and
branch vessels and coronary arteries.

## 2017-04-06 ENCOUNTER — Ambulatory Visit
Admission: RE | Admit: 2017-04-06 | Discharge: 2017-04-06 | Disposition: A | Discharge status: Home / Self Care | Payer: PPO | Source: Ambulatory Visit | Attending: Internal Medicine | Admitting: Internal Medicine

## 2017-04-06 DIAGNOSIS — R1900 Intra-abdominal and pelvic swelling, mass and lump, unspecified site: Secondary | ICD-10-CM | POA: Diagnosis not present

## 2017-04-06 DIAGNOSIS — I2510 Atherosclerotic heart disease of native coronary artery without angina pectoris: Secondary | ICD-10-CM | POA: Diagnosis not present

## 2017-04-06 DIAGNOSIS — R1084 Generalized abdominal pain: Secondary | ICD-10-CM | POA: Diagnosis not present

## 2017-04-06 DIAGNOSIS — I701 Atherosclerosis of renal artery: Secondary | ICD-10-CM | POA: Insufficient documentation

## 2017-04-06 DIAGNOSIS — K5730 Diverticulosis of large intestine without perforation or abscess without bleeding: Secondary | ICD-10-CM | POA: Insufficient documentation

## 2017-04-06 DIAGNOSIS — R1902 Left upper quadrant abdominal swelling, mass and lump: Secondary | ICD-10-CM | POA: Diagnosis not present

## 2017-04-06 DIAGNOSIS — I700 Atherosclerosis of aorta: Secondary | ICD-10-CM | POA: Insufficient documentation

## 2017-04-06 DIAGNOSIS — K769 Liver disease, unspecified: Secondary | ICD-10-CM | POA: Insufficient documentation

## 2017-04-06 LAB — POCT I-STAT CREATININE: CREATININE: 1.6 mg/dL — AB (ref 0.61–1.24)

## 2017-04-06 MED ORDER — IOPAMIDOL (ISOVUE-300) INJECTION 61%
75.0000 mL | Freq: Once | INTRAVENOUS | Status: AC | PRN
  Administered 2017-04-06: 75 mL via INTRAVENOUS

## 2017-06-28 DIAGNOSIS — L821 Other seborrheic keratosis: Secondary | ICD-10-CM | POA: Diagnosis not present

## 2017-06-28 DIAGNOSIS — L300 Nummular dermatitis: Secondary | ICD-10-CM | POA: Diagnosis not present

## 2017-07-12 DIAGNOSIS — N183 Chronic kidney disease, stage 3 (moderate): Secondary | ICD-10-CM | POA: Diagnosis not present

## 2017-07-12 DIAGNOSIS — I10 Essential (primary) hypertension: Secondary | ICD-10-CM | POA: Diagnosis not present

## 2017-07-12 DIAGNOSIS — I700 Atherosclerosis of aorta: Secondary | ICD-10-CM | POA: Diagnosis not present

## 2017-07-12 DIAGNOSIS — Z125 Encounter for screening for malignant neoplasm of prostate: Secondary | ICD-10-CM | POA: Diagnosis not present

## 2017-07-19 DIAGNOSIS — E782 Mixed hyperlipidemia: Secondary | ICD-10-CM | POA: Diagnosis not present

## 2017-07-19 DIAGNOSIS — I700 Atherosclerosis of aorta: Secondary | ICD-10-CM | POA: Diagnosis not present

## 2017-07-19 DIAGNOSIS — Z0000 Encounter for general adult medical examination without abnormal findings: Secondary | ICD-10-CM | POA: Diagnosis not present

## 2017-07-19 DIAGNOSIS — L739 Follicular disorder, unspecified: Secondary | ICD-10-CM | POA: Diagnosis not present

## 2017-09-27 DIAGNOSIS — T23261A Burn of second degree of back of right hand, initial encounter: Secondary | ICD-10-CM | POA: Diagnosis not present

## 2017-12-28 DIAGNOSIS — S299XXA Unspecified injury of thorax, initial encounter: Secondary | ICD-10-CM | POA: Diagnosis not present

## 2017-12-28 DIAGNOSIS — R0781 Pleurodynia: Secondary | ICD-10-CM | POA: Diagnosis not present

## 2017-12-28 DIAGNOSIS — R0602 Shortness of breath: Secondary | ICD-10-CM | POA: Diagnosis not present

## 2018-01-09 DIAGNOSIS — E782 Mixed hyperlipidemia: Secondary | ICD-10-CM | POA: Diagnosis not present

## 2018-01-09 DIAGNOSIS — I700 Atherosclerosis of aorta: Secondary | ICD-10-CM | POA: Diagnosis not present

## 2018-01-16 DIAGNOSIS — Z125 Encounter for screening for malignant neoplasm of prostate: Secondary | ICD-10-CM | POA: Diagnosis not present

## 2018-01-16 DIAGNOSIS — N183 Chronic kidney disease, stage 3 (moderate): Secondary | ICD-10-CM | POA: Diagnosis not present

## 2018-01-16 DIAGNOSIS — Z0000 Encounter for general adult medical examination without abnormal findings: Secondary | ICD-10-CM | POA: Diagnosis not present

## 2018-01-16 DIAGNOSIS — E782 Mixed hyperlipidemia: Secondary | ICD-10-CM | POA: Diagnosis not present

## 2018-01-16 DIAGNOSIS — K869 Disease of pancreas, unspecified: Secondary | ICD-10-CM | POA: Diagnosis not present

## 2018-01-16 DIAGNOSIS — M5116 Intervertebral disc disorders with radiculopathy, lumbar region: Secondary | ICD-10-CM | POA: Diagnosis not present

## 2018-01-17 ENCOUNTER — Other Ambulatory Visit: Payer: Self-pay | Admitting: Internal Medicine

## 2018-01-17 DIAGNOSIS — K8689 Other specified diseases of pancreas: Secondary | ICD-10-CM

## 2018-01-31 ENCOUNTER — Ambulatory Visit
Admission: RE | Admit: 2018-01-31 | Discharge: 2018-01-31 | Disposition: A | Discharge status: Home / Self Care | Payer: PPO | Source: Ambulatory Visit | Attending: Internal Medicine | Admitting: Internal Medicine

## 2018-01-31 DIAGNOSIS — K8689 Other specified diseases of pancreas: Secondary | ICD-10-CM

## 2018-01-31 DIAGNOSIS — K5730 Diverticulosis of large intestine without perforation or abscess without bleeding: Secondary | ICD-10-CM | POA: Insufficient documentation

## 2018-01-31 DIAGNOSIS — K869 Disease of pancreas, unspecified: Secondary | ICD-10-CM | POA: Insufficient documentation

## 2018-01-31 DIAGNOSIS — I700 Atherosclerosis of aorta: Secondary | ICD-10-CM | POA: Insufficient documentation

## 2018-01-31 DIAGNOSIS — K760 Fatty (change of) liver, not elsewhere classified: Secondary | ICD-10-CM | POA: Insufficient documentation

## 2018-01-31 DIAGNOSIS — I2510 Atherosclerotic heart disease of native coronary artery without angina pectoris: Secondary | ICD-10-CM | POA: Insufficient documentation

## 2018-01-31 DIAGNOSIS — M419 Scoliosis, unspecified: Secondary | ICD-10-CM | POA: Insufficient documentation

## 2018-01-31 HISTORY — DX: Disorder of kidney and ureter, unspecified: N28.9

## 2018-01-31 MED ORDER — IOPAMIDOL (ISOVUE-300) INJECTION 61%
75.0000 mL | Freq: Once | INTRAVENOUS | Status: AC | PRN
  Administered 2018-01-31: 75 mL via INTRAVENOUS

## 2018-05-01 DIAGNOSIS — L03115 Cellulitis of right lower limb: Secondary | ICD-10-CM | POA: Diagnosis not present

## 2018-05-01 DIAGNOSIS — R42 Dizziness and giddiness: Secondary | ICD-10-CM | POA: Diagnosis not present

## 2018-05-01 DIAGNOSIS — L231 Allergic contact dermatitis due to adhesives: Secondary | ICD-10-CM | POA: Diagnosis not present

## 2018-05-15 DIAGNOSIS — M542 Cervicalgia: Secondary | ICD-10-CM | POA: Diagnosis not present

## 2018-05-15 DIAGNOSIS — L03115 Cellulitis of right lower limb: Secondary | ICD-10-CM | POA: Diagnosis not present

## 2018-08-29 DIAGNOSIS — N183 Chronic kidney disease, stage 3 (moderate): Secondary | ICD-10-CM | POA: Diagnosis not present

## 2018-08-29 DIAGNOSIS — M461 Sacroiliitis, not elsewhere classified: Secondary | ICD-10-CM | POA: Diagnosis not present

## 2018-09-05 DIAGNOSIS — M9905 Segmental and somatic dysfunction of pelvic region: Secondary | ICD-10-CM | POA: Diagnosis not present

## 2018-09-05 DIAGNOSIS — M4306 Spondylolysis, lumbar region: Secondary | ICD-10-CM | POA: Diagnosis not present

## 2018-09-05 DIAGNOSIS — M9903 Segmental and somatic dysfunction of lumbar region: Secondary | ICD-10-CM | POA: Diagnosis not present

## 2018-09-05 DIAGNOSIS — M955 Acquired deformity of pelvis: Secondary | ICD-10-CM | POA: Diagnosis not present

## 2018-09-06 DIAGNOSIS — M955 Acquired deformity of pelvis: Secondary | ICD-10-CM | POA: Diagnosis not present

## 2018-09-06 DIAGNOSIS — M9905 Segmental and somatic dysfunction of pelvic region: Secondary | ICD-10-CM | POA: Diagnosis not present

## 2018-09-06 DIAGNOSIS — M9903 Segmental and somatic dysfunction of lumbar region: Secondary | ICD-10-CM | POA: Diagnosis not present

## 2018-09-06 DIAGNOSIS — M4306 Spondylolysis, lumbar region: Secondary | ICD-10-CM | POA: Diagnosis not present

## 2018-09-11 DIAGNOSIS — M4306 Spondylolysis, lumbar region: Secondary | ICD-10-CM | POA: Diagnosis not present

## 2018-09-11 DIAGNOSIS — M955 Acquired deformity of pelvis: Secondary | ICD-10-CM | POA: Diagnosis not present

## 2018-09-11 DIAGNOSIS — M9905 Segmental and somatic dysfunction of pelvic region: Secondary | ICD-10-CM | POA: Diagnosis not present

## 2018-09-11 DIAGNOSIS — M9903 Segmental and somatic dysfunction of lumbar region: Secondary | ICD-10-CM | POA: Diagnosis not present

## 2018-09-13 DIAGNOSIS — M9905 Segmental and somatic dysfunction of pelvic region: Secondary | ICD-10-CM | POA: Diagnosis not present

## 2018-09-13 DIAGNOSIS — M9903 Segmental and somatic dysfunction of lumbar region: Secondary | ICD-10-CM | POA: Diagnosis not present

## 2018-09-13 DIAGNOSIS — M955 Acquired deformity of pelvis: Secondary | ICD-10-CM | POA: Diagnosis not present

## 2018-09-13 DIAGNOSIS — M4306 Spondylolysis, lumbar region: Secondary | ICD-10-CM | POA: Diagnosis not present

## 2018-09-14 DIAGNOSIS — E782 Mixed hyperlipidemia: Secondary | ICD-10-CM | POA: Diagnosis not present

## 2018-09-14 DIAGNOSIS — M9905 Segmental and somatic dysfunction of pelvic region: Secondary | ICD-10-CM | POA: Diagnosis not present

## 2018-09-14 DIAGNOSIS — M955 Acquired deformity of pelvis: Secondary | ICD-10-CM | POA: Diagnosis not present

## 2018-09-14 DIAGNOSIS — M9903 Segmental and somatic dysfunction of lumbar region: Secondary | ICD-10-CM | POA: Diagnosis not present

## 2018-09-14 DIAGNOSIS — N183 Chronic kidney disease, stage 3 (moderate): Secondary | ICD-10-CM | POA: Diagnosis not present

## 2018-09-14 DIAGNOSIS — M4306 Spondylolysis, lumbar region: Secondary | ICD-10-CM | POA: Diagnosis not present

## 2018-09-14 DIAGNOSIS — Z125 Encounter for screening for malignant neoplasm of prostate: Secondary | ICD-10-CM | POA: Diagnosis not present

## 2018-09-18 DIAGNOSIS — M9905 Segmental and somatic dysfunction of pelvic region: Secondary | ICD-10-CM | POA: Diagnosis not present

## 2018-09-18 DIAGNOSIS — M4306 Spondylolysis, lumbar region: Secondary | ICD-10-CM | POA: Diagnosis not present

## 2018-09-18 DIAGNOSIS — M9903 Segmental and somatic dysfunction of lumbar region: Secondary | ICD-10-CM | POA: Diagnosis not present

## 2018-09-18 DIAGNOSIS — M955 Acquired deformity of pelvis: Secondary | ICD-10-CM | POA: Diagnosis not present

## 2018-09-19 DIAGNOSIS — M4306 Spondylolysis, lumbar region: Secondary | ICD-10-CM | POA: Diagnosis not present

## 2018-09-19 DIAGNOSIS — M9903 Segmental and somatic dysfunction of lumbar region: Secondary | ICD-10-CM | POA: Diagnosis not present

## 2018-09-19 DIAGNOSIS — M9905 Segmental and somatic dysfunction of pelvic region: Secondary | ICD-10-CM | POA: Diagnosis not present

## 2018-09-19 DIAGNOSIS — M955 Acquired deformity of pelvis: Secondary | ICD-10-CM | POA: Diagnosis not present

## 2018-09-21 DIAGNOSIS — M4306 Spondylolysis, lumbar region: Secondary | ICD-10-CM | POA: Diagnosis not present

## 2018-09-21 DIAGNOSIS — E782 Mixed hyperlipidemia: Secondary | ICD-10-CM | POA: Diagnosis not present

## 2018-09-21 DIAGNOSIS — M955 Acquired deformity of pelvis: Secondary | ICD-10-CM | POA: Diagnosis not present

## 2018-09-21 DIAGNOSIS — Z0000 Encounter for general adult medical examination without abnormal findings: Secondary | ICD-10-CM | POA: Diagnosis not present

## 2018-09-21 DIAGNOSIS — N183 Chronic kidney disease, stage 3 (moderate): Secondary | ICD-10-CM | POA: Diagnosis not present

## 2018-09-21 DIAGNOSIS — M9903 Segmental and somatic dysfunction of lumbar region: Secondary | ICD-10-CM | POA: Diagnosis not present

## 2018-09-21 DIAGNOSIS — M9905 Segmental and somatic dysfunction of pelvic region: Secondary | ICD-10-CM | POA: Diagnosis not present

## 2018-09-25 DIAGNOSIS — M955 Acquired deformity of pelvis: Secondary | ICD-10-CM | POA: Diagnosis not present

## 2018-09-25 DIAGNOSIS — M4306 Spondylolysis, lumbar region: Secondary | ICD-10-CM | POA: Diagnosis not present

## 2018-09-25 DIAGNOSIS — M9905 Segmental and somatic dysfunction of pelvic region: Secondary | ICD-10-CM | POA: Diagnosis not present

## 2018-09-25 DIAGNOSIS — M9903 Segmental and somatic dysfunction of lumbar region: Secondary | ICD-10-CM | POA: Diagnosis not present

## 2018-09-27 DIAGNOSIS — M9905 Segmental and somatic dysfunction of pelvic region: Secondary | ICD-10-CM | POA: Diagnosis not present

## 2018-09-27 DIAGNOSIS — M9903 Segmental and somatic dysfunction of lumbar region: Secondary | ICD-10-CM | POA: Diagnosis not present

## 2018-09-27 DIAGNOSIS — M955 Acquired deformity of pelvis: Secondary | ICD-10-CM | POA: Diagnosis not present

## 2018-09-27 DIAGNOSIS — M4306 Spondylolysis, lumbar region: Secondary | ICD-10-CM | POA: Diagnosis not present

## 2018-09-29 DIAGNOSIS — M4306 Spondylolysis, lumbar region: Secondary | ICD-10-CM | POA: Diagnosis not present

## 2018-09-29 DIAGNOSIS — M955 Acquired deformity of pelvis: Secondary | ICD-10-CM | POA: Diagnosis not present

## 2018-09-29 DIAGNOSIS — M9903 Segmental and somatic dysfunction of lumbar region: Secondary | ICD-10-CM | POA: Diagnosis not present

## 2018-09-29 DIAGNOSIS — M9905 Segmental and somatic dysfunction of pelvic region: Secondary | ICD-10-CM | POA: Diagnosis not present

## 2018-10-02 DIAGNOSIS — M955 Acquired deformity of pelvis: Secondary | ICD-10-CM | POA: Diagnosis not present

## 2018-10-02 DIAGNOSIS — M9905 Segmental and somatic dysfunction of pelvic region: Secondary | ICD-10-CM | POA: Diagnosis not present

## 2018-10-02 DIAGNOSIS — M4306 Spondylolysis, lumbar region: Secondary | ICD-10-CM | POA: Diagnosis not present

## 2018-10-02 DIAGNOSIS — M9903 Segmental and somatic dysfunction of lumbar region: Secondary | ICD-10-CM | POA: Diagnosis not present

## 2018-10-04 DIAGNOSIS — M955 Acquired deformity of pelvis: Secondary | ICD-10-CM | POA: Diagnosis not present

## 2018-10-04 DIAGNOSIS — M4306 Spondylolysis, lumbar region: Secondary | ICD-10-CM | POA: Diagnosis not present

## 2018-10-04 DIAGNOSIS — M9903 Segmental and somatic dysfunction of lumbar region: Secondary | ICD-10-CM | POA: Diagnosis not present

## 2018-10-04 DIAGNOSIS — M9905 Segmental and somatic dysfunction of pelvic region: Secondary | ICD-10-CM | POA: Diagnosis not present

## 2018-10-09 DIAGNOSIS — M955 Acquired deformity of pelvis: Secondary | ICD-10-CM | POA: Diagnosis not present

## 2018-10-09 DIAGNOSIS — M9903 Segmental and somatic dysfunction of lumbar region: Secondary | ICD-10-CM | POA: Diagnosis not present

## 2018-10-09 DIAGNOSIS — M4306 Spondylolysis, lumbar region: Secondary | ICD-10-CM | POA: Diagnosis not present

## 2018-10-09 DIAGNOSIS — M9905 Segmental and somatic dysfunction of pelvic region: Secondary | ICD-10-CM | POA: Diagnosis not present

## 2018-10-11 DIAGNOSIS — M9905 Segmental and somatic dysfunction of pelvic region: Secondary | ICD-10-CM | POA: Diagnosis not present

## 2018-10-11 DIAGNOSIS — M4306 Spondylolysis, lumbar region: Secondary | ICD-10-CM | POA: Diagnosis not present

## 2018-10-11 DIAGNOSIS — M955 Acquired deformity of pelvis: Secondary | ICD-10-CM | POA: Diagnosis not present

## 2018-10-11 DIAGNOSIS — M9903 Segmental and somatic dysfunction of lumbar region: Secondary | ICD-10-CM | POA: Diagnosis not present

## 2018-10-17 DIAGNOSIS — M9903 Segmental and somatic dysfunction of lumbar region: Secondary | ICD-10-CM | POA: Diagnosis not present

## 2018-10-17 DIAGNOSIS — M4306 Spondylolysis, lumbar region: Secondary | ICD-10-CM | POA: Diagnosis not present

## 2018-10-17 DIAGNOSIS — M9905 Segmental and somatic dysfunction of pelvic region: Secondary | ICD-10-CM | POA: Diagnosis not present

## 2018-10-17 DIAGNOSIS — M955 Acquired deformity of pelvis: Secondary | ICD-10-CM | POA: Diagnosis not present

## 2018-10-24 DIAGNOSIS — M955 Acquired deformity of pelvis: Secondary | ICD-10-CM | POA: Diagnosis not present

## 2018-10-24 DIAGNOSIS — M9903 Segmental and somatic dysfunction of lumbar region: Secondary | ICD-10-CM | POA: Diagnosis not present

## 2018-10-24 DIAGNOSIS — M4306 Spondylolysis, lumbar region: Secondary | ICD-10-CM | POA: Diagnosis not present

## 2018-10-24 DIAGNOSIS — M9905 Segmental and somatic dysfunction of pelvic region: Secondary | ICD-10-CM | POA: Diagnosis not present

## 2018-10-30 DIAGNOSIS — M9903 Segmental and somatic dysfunction of lumbar region: Secondary | ICD-10-CM | POA: Diagnosis not present

## 2018-10-30 DIAGNOSIS — M955 Acquired deformity of pelvis: Secondary | ICD-10-CM | POA: Diagnosis not present

## 2018-10-30 DIAGNOSIS — M4306 Spondylolysis, lumbar region: Secondary | ICD-10-CM | POA: Diagnosis not present

## 2018-10-30 DIAGNOSIS — M9905 Segmental and somatic dysfunction of pelvic region: Secondary | ICD-10-CM | POA: Diagnosis not present

## 2019-01-10 ENCOUNTER — Emergency Department
Admission: EM | Admit: 2019-01-10 | Discharge: 2019-01-10 | Disposition: A | Discharge status: Home / Self Care | Payer: PPO | Attending: Emergency Medicine | Admitting: Emergency Medicine

## 2019-01-10 ENCOUNTER — Other Ambulatory Visit: Payer: Self-pay

## 2019-01-10 ENCOUNTER — Encounter: Payer: Self-pay | Admitting: Emergency Medicine

## 2019-01-10 ENCOUNTER — Emergency Department: Payer: PPO

## 2019-01-10 DIAGNOSIS — K859 Acute pancreatitis without necrosis or infection, unspecified: Secondary | ICD-10-CM | POA: Diagnosis not present

## 2019-01-10 DIAGNOSIS — Z87891 Personal history of nicotine dependence: Secondary | ICD-10-CM | POA: Insufficient documentation

## 2019-01-10 DIAGNOSIS — I129 Hypertensive chronic kidney disease with stage 1 through stage 4 chronic kidney disease, or unspecified chronic kidney disease: Secondary | ICD-10-CM | POA: Insufficient documentation

## 2019-01-10 DIAGNOSIS — R079 Chest pain, unspecified: Secondary | ICD-10-CM | POA: Diagnosis not present

## 2019-01-10 DIAGNOSIS — N189 Chronic kidney disease, unspecified: Secondary | ICD-10-CM | POA: Insufficient documentation

## 2019-01-10 DIAGNOSIS — R1084 Generalized abdominal pain: Secondary | ICD-10-CM | POA: Diagnosis not present

## 2019-01-10 DIAGNOSIS — R0789 Other chest pain: Secondary | ICD-10-CM | POA: Diagnosis not present

## 2019-01-10 LAB — HEPATIC FUNCTION PANEL
ALT: 105 U/L — ABNORMAL HIGH (ref 0–44)
AST: 138 U/L — AB (ref 15–41)
Albumin: 4.4 g/dL (ref 3.5–5.0)
Alkaline Phosphatase: 40 U/L (ref 38–126)
BILIRUBIN DIRECT: 0.1 mg/dL (ref 0.0–0.2)
Indirect Bilirubin: 0.7 mg/dL (ref 0.3–0.9)
Total Bilirubin: 0.8 mg/dL (ref 0.3–1.2)
Total Protein: 7.4 g/dL (ref 6.5–8.1)

## 2019-01-10 LAB — BASIC METABOLIC PANEL
Anion gap: 11 (ref 5–15)
BUN: 22 mg/dL (ref 8–23)
CO2: 23 mmol/L (ref 22–32)
Calcium: 10 mg/dL (ref 8.9–10.3)
Chloride: 102 mmol/L (ref 98–111)
Creatinine, Ser: 1.53 mg/dL — ABNORMAL HIGH (ref 0.61–1.24)
GFR calc Af Amer: 50 mL/min — ABNORMAL LOW (ref >60)
GFR calc non Af Amer: 43 mL/min — ABNORMAL LOW (ref >60)
Glucose, Bld: 135 mg/dL — ABNORMAL HIGH (ref 70–99)
Potassium: 4.5 mmol/L (ref 3.5–5.1)
Sodium: 136 mmol/L (ref 135–145)

## 2019-01-10 LAB — CBC
HEMATOCRIT: 46.9 % (ref 39.0–52.0)
Hemoglobin: 15.8 g/dL (ref 13.0–17.0)
MCH: 31.5 pg (ref 26.0–34.0)
MCHC: 33.7 g/dL (ref 30.0–36.0)
MCV: 93.6 fL (ref 80.0–100.0)
Platelets: 245 10*3/uL (ref 150–400)
RBC: 5.01 MIL/uL (ref 4.22–5.81)
RDW: 12.4 % (ref 11.5–15.5)
WBC: 12.2 10*3/uL — ABNORMAL HIGH (ref 4.0–10.5)
nRBC: 0 % (ref 0.0–0.2)

## 2019-01-10 LAB — TROPONIN I
Troponin I: <0.03 ng/mL (ref <0.03)
Troponin I: <0.03 ng/mL (ref <0.03)

## 2019-01-10 LAB — LIPASE, BLOOD: Lipase: 98 U/L — ABNORMAL HIGH (ref 11–51)

## 2019-01-10 MED ORDER — SODIUM CHLORIDE 0.9 % IV BOLUS: 1000.0000 mL | Freq: Once | INTRAVENOUS | Status: DC

## 2019-01-10 MED ORDER — ACETAMINOPHEN 325 MG PO TABS: 650.0000 mg | ORAL_TABLET | Freq: Once | ORAL | Status: DC

## 2019-01-10 MED ORDER — ONDANSETRON 4 MG PO TBDP: 4.0000 mg | ORAL_TABLET | Freq: Three times a day (TID) | ORAL | 0 refills | Status: AC | PRN

## 2019-01-10 NOTE — ED Provider Notes (Addendum)
[facility] Emergency Department Provider Note  ____________________________________________  Time seen: Approximately 10:11 PM  I have reviewed the triage vital signs and the nursing notes.   HISTORY  Chief Complaint Chest Pain    HPI Shawn Stafford is a 78 y.o. male with a history of HTN, HL, pancreatitis, presenting with chest pain and upper abdominal pain, nausea and vomiting.  The patient states that he woke up at 5 AM with a central chest pressure that persisted for several hours.  Initially, it felt like his GERD, but it did not improve with Alka-Seltzer, which was unusual.  He tried drinking ginger ale and burping, and this did not improve his pain.  The only thing that made it better was laying on his abdomen.  After a nap, several hours past the patient was able to eat and drink without any difficulty.  However, then he had 2 more episodes this evening.  He describes a pain that moved to the right upper quadrant, and radiated to the back, and improved with one episode of nausea and vomiting.  He has not had any fevers or chills.  He did not have any associated diaphoresis, shortness of breath, palpitations, lightheadedness or syncope.  Patient was seen at [facility] clinic by his PMD prior to arrival here, with concern for an abnormal EKG.  Past Medical History:  Diagnosis Date  . Aortic atherosclerosis ([place])   . Arthritis    OSTEOARTHRITIS RIGHT HIP  . CKD (chronic kidney disease)   . Compression fracture   . Depression   . Diverticulosis   . History of pancreatitis   . Hyperlipidemia   . Hypertension   . Prostate disorder   . Renal insufficiency    Diagnosed with Chronic kidney disease.     Patient Active Problem List   Diagnosis Date Noted  . Acute pancreatitis 03/28/2016  . Lumbar spondylosis 09/21/2012  . Cervical spondylosis 08/07/2012  . Cervical strain 08/07/2012  . Spinal stenosis of lumbar region with radiculopathy 08/07/2012     Past Surgical History:  Procedure Laterality Date  . CATARACT EXTRACTION    . COLONOSCOPY WITH PROPOFOL N/A 11/01/2016   Procedure: COLONOSCOPY WITH PROPOFOL;  Surgeon: Manya Silvas, MD;  Location: [facility] ENDOSCOPY;  Service: Endoscopy;  Laterality: N/A;  . ESOPHAGOGASTRODUODENOSCOPY (EGD) WITH PROPOFOL N/A 11/01/2016   Procedure: ESOPHAGOGASTRODUODENOSCOPY (EGD) WITH PROPOFOL;  Surgeon: Manya Silvas, MD;  Location: [facility] ENDOSCOPY;  Service: Endoscopy;  Laterality: N/A;  . HERNIA REPAIR    . KYPHOPLASTY    . SPINE SURGERY      Current Outpatient Rx  . Order #: [id number] Class: Historical Med  . Order #: [id number] Class: Historical Med  . Order #: [id number] Class: Historical Med  . Order #: [id number] Class: Historical Med  . Order #: [id number] Class: Historical Med  . Order #: [id number] Class: Historical Med  . Order #: [id number] Class: Historical Med  . Order #: [id number] Class: Print  . Order #: [id number] Class: Historical Med  . Order #: [id number] Class: Historical Med  . Order #: [id number] Class: Print  . Order #: [id number] Class: Historical Med  . Order #: [id number] Class: Historical Med  . Order #: [id number] Class: Normal  . Order #: [id number] Class: Historical Med    Allergies Fentanyl and Ibuprofen  Family History  Problem Relation Age of Onset  . Dementia Mother   . Colon cancer Father     Social History Social History   Tobacco Use  . Smoking status: Former Smoker    Last attempt to quit: 11/23/1967  Years since quitting: 51.1  . Smokeless tobacco: Never Used  Substance Use Topics  . Alcohol use: No  . Drug use: No    Review of Systems Constitutional: No fever/chills.  No lightheadedness or syncope. Eyes: No visual changes. ENT: No sore throat. No congestion or rhinorrhea. Cardiovascular: Positive central chest pain. Denies palpitations. Respiratory: Denies shortness of breath.  No cough. Gastrointestinal: Positive right upper quadrant abdominal pain  radiating to the right side of the back.  One episode of nausea and vomiting, now resolved.  No diarrhea.  No constipation.  Change in pain with bowel movement. Genitourinary: Negative for dysuria. Musculoskeletal: Positive for back pain. Skin: Negative for rash. Neurological: Negative for headaches. No focal numbness, tingling or weakness.     ____________________________________________   PHYSICAL EXAM:  VITAL SIGNS: ED Triage Vitals  Enc Vitals Group     BP 01/10/19 1751 131/64     Pulse Rate 01/10/19 1751 67     Resp 01/10/19 1751 18     Temp 01/10/19 1751 98.1 F (36.7 C)     Temp Source 01/10/19 1751 Oral     SpO2 01/10/19 1751 100 %     Weight 01/10/19 1753 207 lb (93.9 kg)     Height 01/10/19 1753 5\' 8"  (1.727 m)     Head Circumference --      Peak Flow --      Pain Score 01/10/19 1753 0     Pain Loc --      Pain Edu? --      Excl. in [place]? --     Constitutional: Alert and oriented.  Answers questions appropriately.  Chronically ill-appearing. Eyes: Conjunctivae are normal.  EOMI. No scleral icterus. Head: Atraumatic. Nose: No congestion/rhinnorhea. Mouth/Throat: Mucous membranes are mildly dry.  Neck: No stridor.  Supple.  No JVD.  No meningismus. Cardiovascular: Normal rate, regular rhythm. No murmurs, rubs or gallops.  Respiratory: Normal respiratory effort.  No accessory muscle use or retractions. Lungs CTAB.  No wheezes, rales or ronchi. Gastrointestinal: Overweight.  Soft, mildly tender to palpation in the epigastrium and slightly to the right of the epigastrium.  Negative Murphy sign.  Nondistended.  No guarding or rebound.  No peritoneal signs. Musculoskeletal: No LE edema. No ttp in the calves or palpable cords.  Negative Homan's sign. Neurologic:  A&Ox3.  Speech is clear.  Face and smile are symmetric.  EOMI.  Moves all extremities well. Skin:  Skin is warm, dry and intact. No rash noted. Psychiatric: Mood and affect are normal. Speech and behavior are  normal.  Normal judgement.  ____________________________________________   LABS (all labs ordered are listed, but only abnormal results are displayed)  Labs Reviewed  BASIC METABOLIC PANEL - Abnormal; Notable for the following components:      Result Value   Glucose, Bld 135 (*)    Creatinine, Ser 1.53 (*)    GFR calc non Af Amer 43 (*)    GFR calc Af Amer 50 (*)    All other components within normal limits  CBC - Abnormal; Notable for the following components:   WBC 12.2 (*)    All other components within normal limits  LIPASE, BLOOD - Abnormal; Notable for the following components:   Lipase 98 (*)    All other components within normal limits  HEPATIC FUNCTION PANEL - Abnormal; Notable for the following components:   AST 138 (*)    ALT 105 (*)    All other components within normal limits  TROPONIN I  TROPONIN I   ____________________________________________  EKG  ED ECG REPORT I, Anne-Caroline Mariea Clonts, the attending physician, personally viewed and interpreted this ECG.   Date: 01/10/2019  EKG Time: 1753  Rate: 69  Rhythm: normal sinus rhythm  Axis: leftward  Intervals:first-degree A-V block   ST&T Change: No STEMI  ____________________________________________  RADIOLOGY  Dg Chest 2 View  Result Date: 01/10/2019 CLINICAL DATA:  Intermittent chest pain. EXAM: CHEST - 2 VIEW COMPARISON:  02/12/2013 FINDINGS: The lungs are clear without focal pneumonia, edema, pneumothorax or pleural effusion. Interstitial markings are diffusely coarsened with chronic features. Chronic atelectasis or scarring noted left base. The cardiopericardial silhouette is within normal limits for size. The visualized bony structures of the thorax are intact. IMPRESSION: No active cardiopulmonary disease. Electronically Signed   By: Misty Stanley M.D.   On: 01/10/2019 18:46    ____________________________________________   PROCEDURES  Procedure(s) performed: None  Procedures  Critical  Care performed: No ____________________________________________   INITIAL IMPRESSION / ASSESSMENT AND PLAN / ED COURSE  Pertinent labs & imaging results that were available during my care of the patient were reviewed by me and considered in my medical decision making (see chart for details).  78 y.o. male with a history of HTN, HL, and pancreatitis presenting with chest pain, now with epigastric pain radiating to the back with an episode of nausea and vomiting.  Overall, the patient is hemodynamically stable.  I am most concerned about early pancreatitis and a lipase has been added to his blood work.  The patient was also having chest pain and I have reviewed his EKG from [facility] clinic which does not show any true ischemia.  Here, his EKG also does not show ischemia or arrhythmia.  He has one negative troponin and a second 1 is pending.  At this time, the patient is not having any chest discomfort and he had no associated red flag warnings.  While possible, ACS or MI is unlikely.  The patient's laboratory studies are reassuring and his chest x-ray does not show any acute abnormalities.  I will plan to treat him with Tylenol, GI rest, and reevaluate him for final disposition.  ----------------------------------------- 10:48 PM on 01/10/2019 -----------------------------------------  The patient's lipase today is 98.  He has a white blood cell count of 12.2, but is otherwise afebrile, has minimal pain with good pain control with Tylenol, and is able to drink fluid without vomiting.  He does not meet criteria for admission, but I have had a long discussion with him and his family member about red flag precautions and when to come back.  At this time, the patient will be discharged with follow-up for repeat abdominal examination and reevaluation with his PMD tomorrow.  ____________________________________________  FINAL CLINICAL IMPRESSION(S) / ED DIAGNOSES  Final diagnoses:  Acute pancreatitis,  unspecified complication status, unspecified pancreatitis type         NEW MEDICATIONS STARTED DURING THIS VISIT:  New Prescriptions   ONDANSETRON (ZOFRAN ODT) 4 MG DISINTEGRATING TABLET    Take 1 tablet (4 mg total) by mouth every 8 (eight) hours as needed.      Eula Listen, MD 01/10/19 2218    Eula Listen, MD 01/10/19 2249

## 2019-01-10 NOTE — Discharge Instructions (Signed)
Take a clear liquid diet for the next 3 days, then advance to full liquid diet for 2 days, then you may advance to bland diet as tolerated.  Please follow-up with your primary care physician tomorrow, for reevaluation.  You may take Zofran as needed for nausea or vomiting and Tylenol or Motrin as needed for pain or fever.  Turn to the emergency department if you develop severe pain, fever, lightheadedness or fainting, inability to keep down fluids, or any other symptoms concerning to you.

## 2019-01-10 NOTE — ED Triage Notes (Signed)
PT arrives with complaints of intermittent chest pain. Pt states the pain started this morning at 0500 and has been intermittent since. Pt denies any current pain but reports it was present when he was [facility] prior to coming to ED. PT was told to come in to the ED due to EKG abnormality. Pt states when the pain is present it feels like pressure.

## 2019-01-11 ENCOUNTER — Ambulatory Visit
Admission: RE | Admit: 2019-01-11 | Discharge: 2019-01-11 | Disposition: A | Discharge status: Home / Self Care | Payer: PPO | Source: Ambulatory Visit | Attending: Internal Medicine | Admitting: Internal Medicine

## 2019-01-11 ENCOUNTER — Other Ambulatory Visit: Payer: Self-pay | Admitting: Internal Medicine

## 2019-01-11 DIAGNOSIS — R1011 Right upper quadrant pain: Secondary | ICD-10-CM

## 2019-01-11 DIAGNOSIS — K8500 Idiopathic acute pancreatitis without necrosis or infection: Secondary | ICD-10-CM | POA: Diagnosis not present

## 2019-03-20 DIAGNOSIS — E782 Mixed hyperlipidemia: Secondary | ICD-10-CM | POA: Diagnosis not present

## 2019-03-27 DIAGNOSIS — I10 Essential (primary) hypertension: Secondary | ICD-10-CM | POA: Diagnosis not present

## 2019-03-27 DIAGNOSIS — I700 Atherosclerosis of aorta: Secondary | ICD-10-CM | POA: Diagnosis not present

## 2019-03-27 DIAGNOSIS — Z125 Encounter for screening for malignant neoplasm of prostate: Secondary | ICD-10-CM | POA: Diagnosis not present

## 2019-03-27 DIAGNOSIS — N183 Chronic kidney disease, stage 3 (moderate): Secondary | ICD-10-CM | POA: Diagnosis not present

## 2019-03-27 DIAGNOSIS — E782 Mixed hyperlipidemia: Secondary | ICD-10-CM | POA: Diagnosis not present

## 2019-03-27 DIAGNOSIS — Z0000 Encounter for general adult medical examination without abnormal findings: Secondary | ICD-10-CM | POA: Diagnosis not present

## 2019-04-23 DIAGNOSIS — M26621 Arthralgia of right temporomandibular joint: Secondary | ICD-10-CM | POA: Diagnosis not present

## 2019-09-26 DIAGNOSIS — Z125 Encounter for screening for malignant neoplasm of prostate: Secondary | ICD-10-CM | POA: Diagnosis not present

## 2019-09-26 DIAGNOSIS — N183 Chronic kidney disease, stage 3 unspecified: Secondary | ICD-10-CM | POA: Diagnosis not present

## 2019-09-26 DIAGNOSIS — E782 Mixed hyperlipidemia: Secondary | ICD-10-CM | POA: Diagnosis not present

## 2019-10-03 DIAGNOSIS — M7521 Bicipital tendinitis, right shoulder: Secondary | ICD-10-CM | POA: Diagnosis not present

## 2019-10-03 DIAGNOSIS — N1831 Chronic kidney disease, stage 3a: Secondary | ICD-10-CM | POA: Diagnosis not present

## 2019-10-03 DIAGNOSIS — I700 Atherosclerosis of aorta: Secondary | ICD-10-CM | POA: Diagnosis not present

## 2019-10-03 DIAGNOSIS — Z0000 Encounter for general adult medical examination without abnormal findings: Secondary | ICD-10-CM | POA: Diagnosis not present

## 2019-10-03 DIAGNOSIS — M461 Sacroiliitis, not elsewhere classified: Secondary | ICD-10-CM | POA: Diagnosis not present

## 2019-10-03 DIAGNOSIS — R739 Hyperglycemia, unspecified: Secondary | ICD-10-CM | POA: Diagnosis not present

## 2019-10-03 DIAGNOSIS — E782 Mixed hyperlipidemia: Secondary | ICD-10-CM | POA: Diagnosis not present

## 2019-10-31 IMAGING — US US ABDOMEN LIMITED
1 series · 14 of 25 positions shown · non-contrast
Comparison: CT abdomen pelvis 01/31/2018

CLINICAL DATA: RIGHT upper quadrant abdominal pain for 2 days,
history of E a pathic acute pancreatitis, hypertension,
hyperlipidemia, former smoker

EXAM:
ULTRASOUND ABDOMEN LIMITED RIGHT UPPER QUADRANT

[Series 1: us abdomen limited · 0.19mm/px · 14 of 48 slices shown]
[im 1/48]
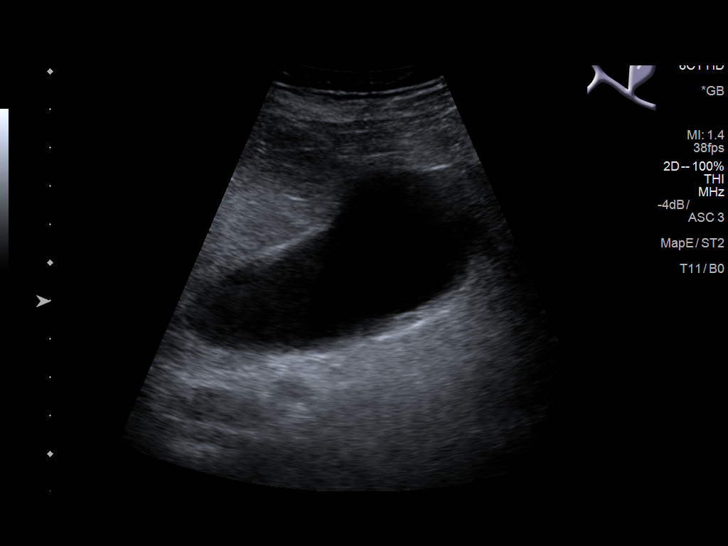
[im 4/48]
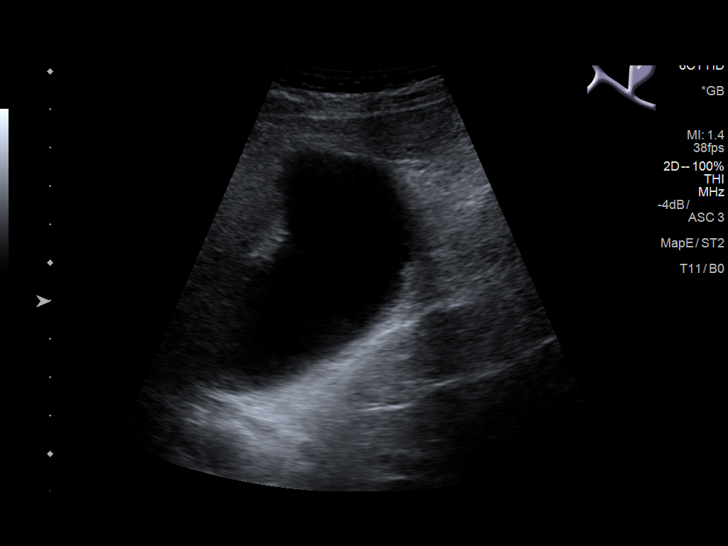
[im 8/48]
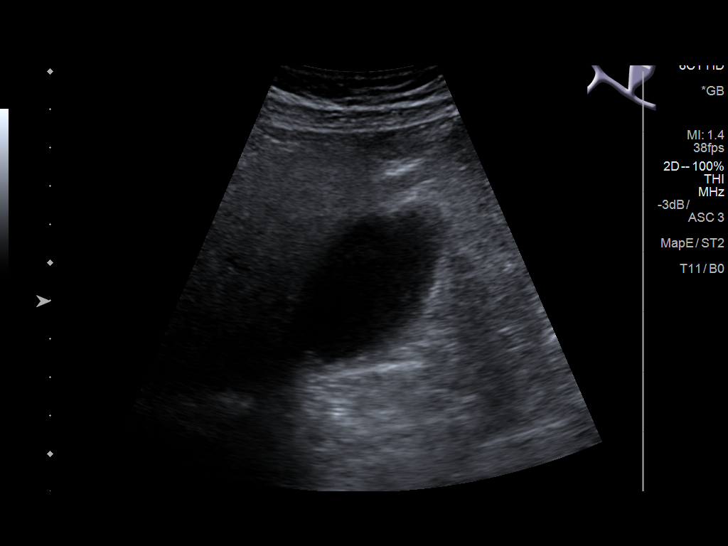
[im 12/48]
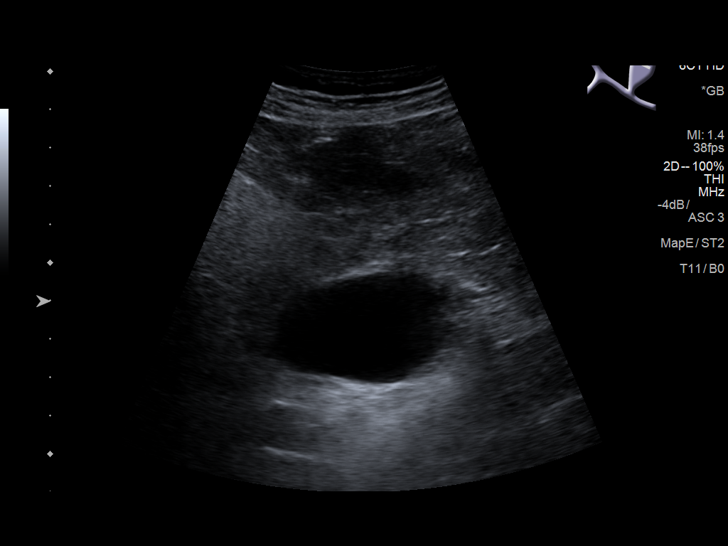
[im 16/48]
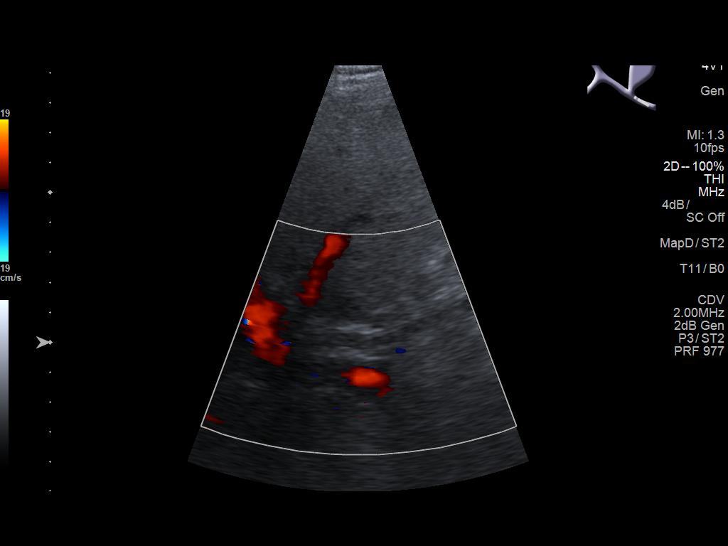
[im 18/48]
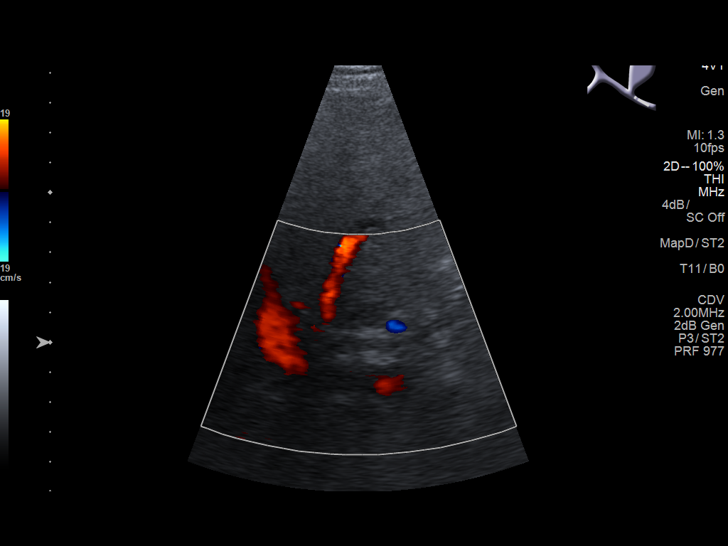
[im 22/48]
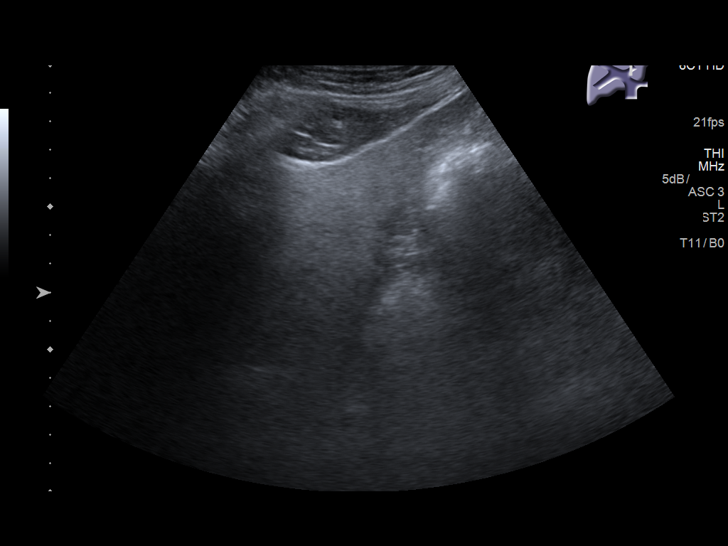
[im 26/48]
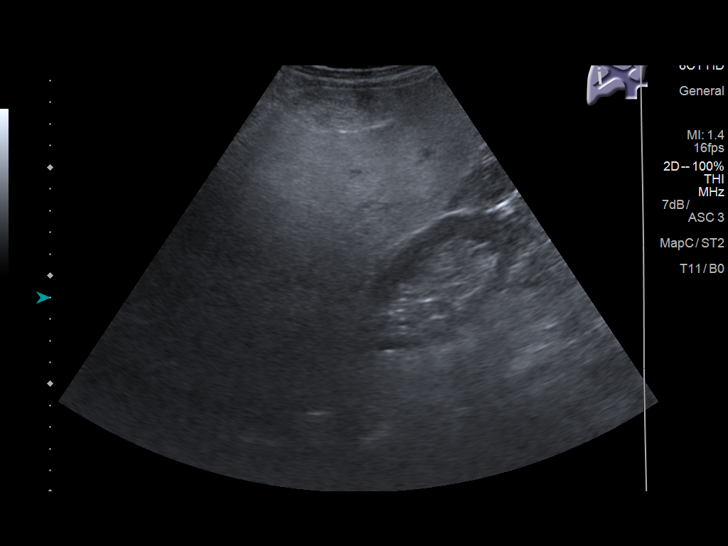
[im 30/48]
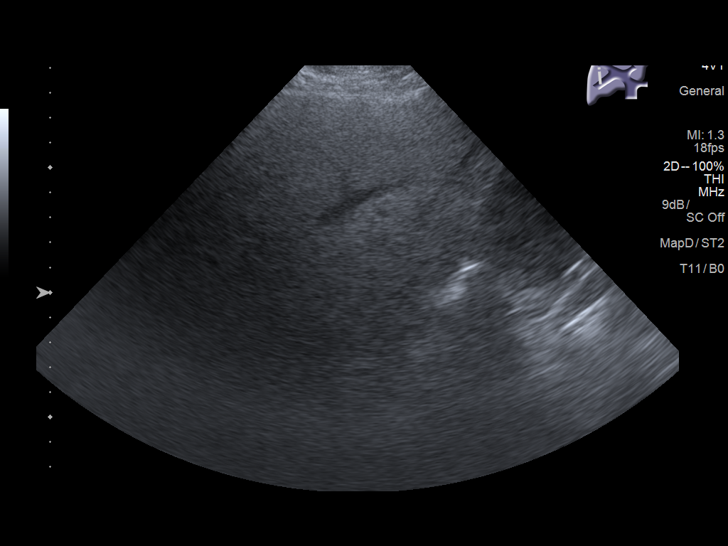
[im 32/48]
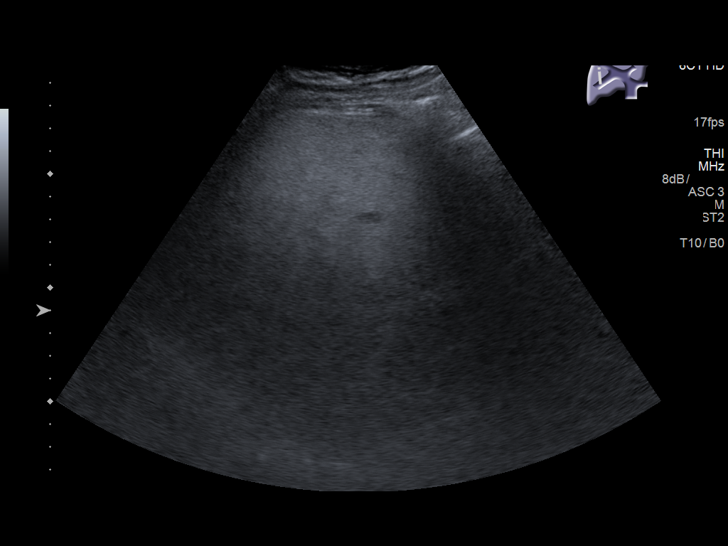
[im 36/48]
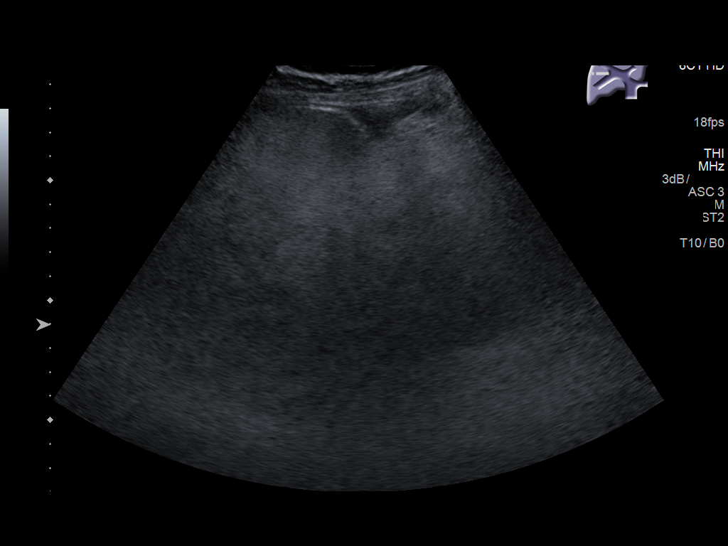
[im 40/48]
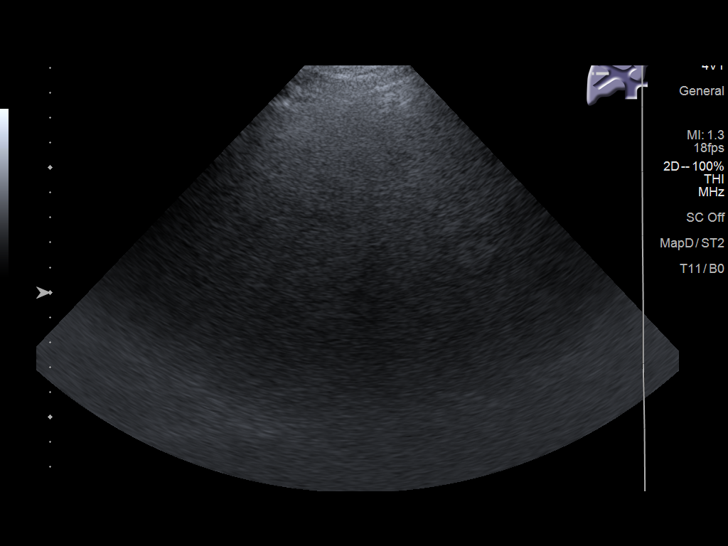
[im 44/48]
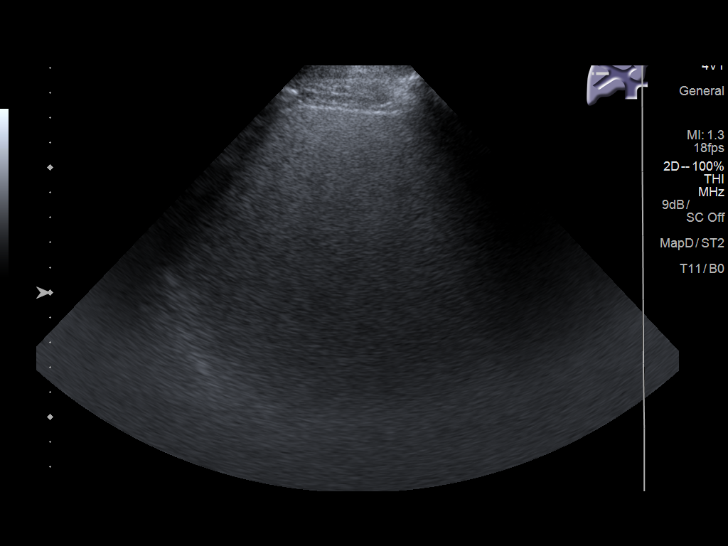
[im 48/48]
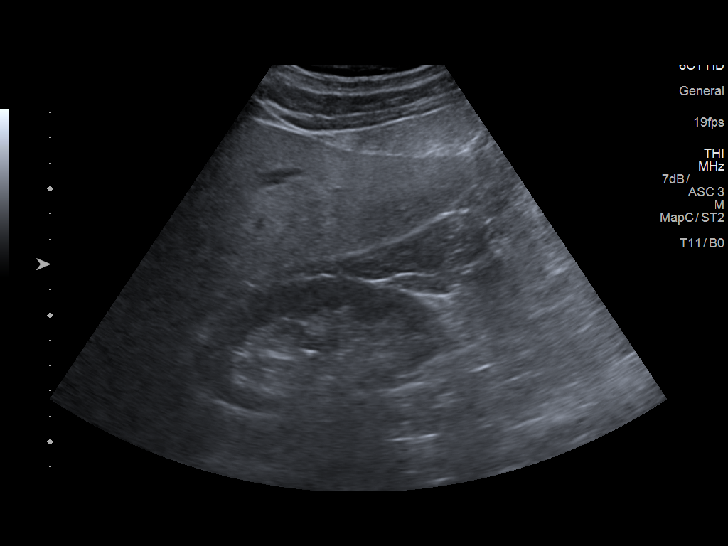

[14 of 25 positions shown; findings below may reference images not displayed]

FINDINGS: Gallbladder:

Normally distended. Normal wall thickness. Question small amount of
dependent sludge. No shadowing calculi, pericholecystic fluid or
sonographic Murphy sign.

Common bile duct:

Diameter: 5 mm diameter, normal

Liver:

Echogenic parenchyma, likely fatty infiltration as demonstrated on
prior CT exam. No focal hepatic mass or nodularity grossly
identified, though assessment of intrahepatic detail is limited due
to sound attenuation as a result of increased hepatic echogenicity.
Portal vein is patent on color Doppler imaging with normal direction
of blood flow towards the liver.

No RIGHT upper quadrant free fluid.
IMPRESSION: Probable fatty infiltration of liver as above.

Otherwise negative exam.

## 2019-12-21 DIAGNOSIS — H4720 Unspecified optic atrophy: Secondary | ICD-10-CM | POA: Diagnosis not present

## 2020-02-14 ENCOUNTER — Ambulatory Visit: Payer: PPO | Attending: Internal Medicine

## 2020-02-14 DIAGNOSIS — Z23 Encounter for immunization: Secondary | ICD-10-CM

## 2020-02-14 NOTE — Progress Notes (Signed)
   Covid-19 Vaccination Clinic  Name:  [name]    MRN: [id number] DOB: Mar 21, 1941  02/14/2020  [name] was observed post Covid-19 immunization for 15 minutes without incident. He was provided with Vaccine Information Sheet and instruction to access the V-Safe system.   [name] was instructed to call 911 with any severe reactions post vaccine: Marland Kitchen Difficulty breathing  . Swelling of face and throat  . A fast heartbeat  . A bad rash all over body  . Dizziness and weakness   Immunizations Administered    Name Date Dose VIS Date Route   Pfizer COVID-19 Vaccine 02/14/2020  1:30 PM 0.3 mL 11/02/2019 Intramuscular   Manufacturer: [place]   Lot: [id number]   [place]io: [id number]

## 2020-03-10 ENCOUNTER — Ambulatory Visit: Payer: PPO | Attending: Internal Medicine

## 2020-03-10 DIAGNOSIS — Z23 Encounter for immunization: Secondary | ICD-10-CM

## 2020-03-10 NOTE — Progress Notes (Signed)
   Covid-19 Vaccination Clinic  Name:  Shawn Stafford    MRN: [id number] DOB: 12-06-1940  03/10/2020  Mr. Shawn Stafford was observed post Covid-19 immunization for 15 minutes without incident. He was provided with Vaccine Information Sheet and instruction to access the V-Safe system.   Mr. Shawn Stafford was instructed to call 911 with any severe reactions post vaccine: Marland Kitchen Difficulty breathing  . Swelling of face and throat  . A fast heartbeat  . A bad rash all over body  . Dizziness and weakness   Immunizations Administered    Name Date Dose VIS Date Route   Pfizer COVID-19 Vaccine 03/10/2020 12:14 PM 0.3 mL 01/16/2019 Intramuscular   Manufacturer: [place]   Lot: [id number]   [place]: [id number]

## 2020-03-25 DIAGNOSIS — R739 Hyperglycemia, unspecified: Secondary | ICD-10-CM | POA: Diagnosis not present

## 2020-03-25 DIAGNOSIS — E782 Mixed hyperlipidemia: Secondary | ICD-10-CM | POA: Diagnosis not present

## 2020-04-01 DIAGNOSIS — I700 Atherosclerosis of aorta: Secondary | ICD-10-CM | POA: Diagnosis not present

## 2020-04-01 DIAGNOSIS — R739 Hyperglycemia, unspecified: Secondary | ICD-10-CM | POA: Diagnosis not present

## 2020-04-01 DIAGNOSIS — Z0000 Encounter for general adult medical examination without abnormal findings: Secondary | ICD-10-CM | POA: Diagnosis not present

## 2020-04-01 DIAGNOSIS — Z125 Encounter for screening for malignant neoplasm of prostate: Secondary | ICD-10-CM | POA: Diagnosis not present

## 2020-04-01 DIAGNOSIS — N1832 Chronic kidney disease, stage 3b: Secondary | ICD-10-CM | POA: Diagnosis not present

## 2020-04-01 DIAGNOSIS — E782 Mixed hyperlipidemia: Secondary | ICD-10-CM | POA: Diagnosis not present

## 2020-04-01 DIAGNOSIS — F1190 Opioid use, unspecified, uncomplicated: Secondary | ICD-10-CM | POA: Diagnosis not present

## 2020-05-28 ENCOUNTER — Emergency Department
Admission: EM | Admit: 2020-05-28 | Discharge: 2020-05-28 | Disposition: A | Discharge status: Home / Self Care | Payer: PPO | Attending: Emergency Medicine | Admitting: Emergency Medicine

## 2020-05-28 ENCOUNTER — Emergency Department: Payer: PPO

## 2020-05-28 ENCOUNTER — Other Ambulatory Visit: Payer: Self-pay

## 2020-05-28 ENCOUNTER — Encounter: Payer: Self-pay | Admitting: *Deleted

## 2020-05-28 DIAGNOSIS — R1110 Vomiting, unspecified: Secondary | ICD-10-CM | POA: Diagnosis not present

## 2020-05-28 DIAGNOSIS — I129 Hypertensive chronic kidney disease with stage 1 through stage 4 chronic kidney disease, or unspecified chronic kidney disease: Secondary | ICD-10-CM | POA: Diagnosis not present

## 2020-05-28 DIAGNOSIS — N189 Chronic kidney disease, unspecified: Secondary | ICD-10-CM | POA: Insufficient documentation

## 2020-05-28 DIAGNOSIS — Z79899 Other long term (current) drug therapy: Secondary | ICD-10-CM | POA: Insufficient documentation

## 2020-05-28 DIAGNOSIS — Z87891 Personal history of nicotine dependence: Secondary | ICD-10-CM | POA: Diagnosis not present

## 2020-05-28 DIAGNOSIS — R1013 Epigastric pain: Secondary | ICD-10-CM | POA: Diagnosis not present

## 2020-05-28 LAB — COMPREHENSIVE METABOLIC PANEL
ALT: 23 U/L (ref 0–44)
AST: 29 U/L (ref 15–41)
Albumin: 5 g/dL (ref 3.5–5.0)
Alkaline Phosphatase: 48 U/L (ref 38–126)
Anion gap: 15 (ref 5–15)
BUN: 25 mg/dL — ABNORMAL HIGH (ref 8–23)
CO2: 23 mmol/L (ref 22–32)
Calcium: 9.9 mg/dL (ref 8.9–10.3)
Chloride: 97 mmol/L — ABNORMAL LOW (ref 98–111)
Creatinine, Ser: 1.75 mg/dL — ABNORMAL HIGH (ref 0.61–1.24)
GFR calc Af Amer: 42 mL/min — ABNORMAL LOW (ref >60)
GFR calc non Af Amer: 36 mL/min — ABNORMAL LOW (ref >60)
Glucose, Bld: 232 mg/dL — ABNORMAL HIGH (ref 70–99)
Potassium: 4 mmol/L (ref 3.5–5.1)
Sodium: 135 mmol/L (ref 135–145)
Total Bilirubin: 0.9 mg/dL (ref 0.3–1.2)
Total Protein: 8.5 g/dL — ABNORMAL HIGH (ref 6.5–8.1)

## 2020-05-28 LAB — URINALYSIS, COMPLETE (UACMP) WITH MICROSCOPIC
Bacteria, UA: NONE SEEN
Bilirubin Urine: NEGATIVE
Glucose, UA: >=500 mg/dL — AB
Hgb urine dipstick: NEGATIVE
Ketones, ur: NEGATIVE mg/dL
Leukocytes,Ua: NEGATIVE
Nitrite: NEGATIVE
Protein, ur: >=300 mg/dL — AB
Specific Gravity, Urine: 1.014 (ref 1.005–1.030)
Squamous Epithelial / HPF: NONE SEEN (ref 0–5)
pH: 7 (ref 5.0–8.0)

## 2020-05-28 LAB — CBC
HCT: 46.3 % (ref 39.0–52.0)
Hemoglobin: 16.1 g/dL (ref 13.0–17.0)
MCH: 31.4 pg (ref 26.0–34.0)
MCHC: 34.8 g/dL (ref 30.0–36.0)
MCV: 90.4 fL (ref 80.0–100.0)
Platelets: 213 10*3/uL (ref 150–400)
RBC: 5.12 MIL/uL (ref 4.22–5.81)
RDW: 12.4 % (ref 11.5–15.5)
WBC: 15.1 10*3/uL — ABNORMAL HIGH (ref 4.0–10.5)
nRBC: 0 % (ref 0.0–0.2)

## 2020-05-28 LAB — LIPASE, BLOOD: Lipase: 29 U/L (ref 11–51)

## 2020-05-28 MED ORDER — SUCRALFATE 1 G PO TABS: 1.0000 g | ORAL_TABLET | Freq: Four times a day (QID) | ORAL | 1 refills | Status: AC

## 2020-05-28 MED ORDER — ONDANSETRON HCL 4 MG/2ML IJ SOLN
4.0000 mg | Freq: Once | INTRAMUSCULAR | Status: AC
  Administered 2020-05-28: 4 mg via INTRAVENOUS
  Filled 2020-05-28: qty 2

## 2020-05-28 MED ORDER — SODIUM CHLORIDE 0.9 % IV SOLN
1000.0000 mL | Freq: Once | INTRAVENOUS | Status: AC
  Administered 2020-05-28: 1000 mL via INTRAVENOUS

## 2020-05-28 MED ORDER — ONDANSETRON 4 MG PO TBDP
4.0000 mg | ORAL_TABLET | Freq: Once | ORAL | Status: AC | PRN
  Administered 2020-05-28: 4 mg via ORAL
  Filled 2020-05-28: qty 1

## 2020-05-28 MED ORDER — MORPHINE SULFATE (PF) 4 MG/ML IV SOLN
4.0000 mg | Freq: Once | INTRAVENOUS | Status: AC
  Administered 2020-05-28: 4 mg via INTRAVENOUS
  Filled 2020-05-28: qty 1

## 2020-05-28 MED ORDER — PANTOPRAZOLE SODIUM 40 MG IV SOLR
40.0000 mg | Freq: Once | INTRAVENOUS | Status: AC
  Administered 2020-05-28: 40 mg via INTRAVENOUS
  Filled 2020-05-28: qty 40

## 2020-05-28 MED ORDER — IOHEXOL 300 MG/ML  SOLN
75.0000 mL | Freq: Once | INTRAMUSCULAR | Status: AC | PRN
  Administered 2020-05-28: 75 mL via INTRAVENOUS
  Filled 2020-05-28: qty 75

## 2020-05-28 MED ORDER — SODIUM CHLORIDE 0.9% FLUSH: 3.0000 mL | Freq: Once | INTRAVENOUS | Status: DC

## 2020-05-28 MED ORDER — PANTOPRAZOLE SODIUM 20 MG PO TBEC: 20.0000 mg | DELAYED_RELEASE_TABLET | Freq: Every day | ORAL | 1 refills | Status: AC

## 2020-05-28 NOTE — ED Triage Notes (Signed)
Patient c/o burning upper abdominal and severe mid back pain which woke him up this morning at 0700. Patient has a history of pancreatitis. Patient reports vomiting  x4 today.

## 2020-05-28 NOTE — ED Provider Notes (Signed)
[facility] Emergency Department Provider Note   ____________________________________________    I have reviewed the triage vital signs and the nursing notes.   HISTORY  Chief Complaint Abdominal Pain     HPI Shawn Stafford is a 79 y.o. male with a history of diverticulosis, pancreatitis who presents with complaints of epigastric abdominal pain which started this morning at 7 AM.  He reports this is worse than prior episodes of pancreatitis.  He reports nausea and vomiting.  He has had 3 bowel movements.  He does feel bloated and distended.  No fevers or chills reported.  Stool is nonbloody.  Has not taken anything for this.  Past Medical History:  Diagnosis Date  . Aortic atherosclerosis ([place])   . Arthritis    OSTEOARTHRITIS RIGHT HIP  . CKD (chronic kidney disease)   . Compression fracture   . Depression   . Diverticulosis   . History of pancreatitis   . Hyperlipidemia   . Hypertension   . Prostate disorder   . Renal insufficiency    Diagnosed with Chronic kidney disease.     Patient Active Problem List   Diagnosis Date Noted  . Acute pancreatitis 03/28/2016  . Lumbar spondylosis 09/21/2012  . Cervical spondylosis 08/07/2012  . Cervical strain 08/07/2012  . Spinal stenosis of lumbar region with radiculopathy 08/07/2012    Past Surgical History:  Procedure Laterality Date  . CATARACT EXTRACTION    . COLONOSCOPY WITH PROPOFOL N/A 11/01/2016   Procedure: COLONOSCOPY WITH PROPOFOL;  Surgeon: Manya Silvas, MD;  Location: [facility] ENDOSCOPY;  Service: Endoscopy;  Laterality: N/A;  . ESOPHAGOGASTRODUODENOSCOPY (EGD) WITH PROPOFOL N/A 11/01/2016   Procedure: ESOPHAGOGASTRODUODENOSCOPY (EGD) WITH PROPOFOL;  Surgeon: Manya Silvas, MD;  Location: [facility] ENDOSCOPY;  Service: Endoscopy;  Laterality: N/A;  . HERNIA REPAIR    . KYPHOPLASTY    . SPINE SURGERY      Prior to Admission medications   Medication Sig Start Date End Date Taking?  Authorizing Provider  acetaminophen (TYLENOL) 500 MG tablet Take 1,000 mg by mouth 3 (three) times daily as needed for mild pain or moderate pain.    [provider]  amLODipine (NORVASC) 5 MG tablet Take 5 mg by mouth daily. 07/04/15   [provider]  aspirin 81 MG chewable tablet Chew 81 mg by mouth every morning.    [provider]  Cholecalciferol (VITAMIN D3) 2000 units capsule Take 2,000 Units by mouth daily.    [provider]  Coenzyme Q10 (CO Q 10) 10 MG CAPS Take 10 mg by mouth daily.    [provider]  fexofenadine-pseudoephedrine (ALLEGRA-D) 60-120 MG 12 hr tablet Take 1 tablet by mouth 2 (two) times daily.    [provider]  fish oil-omega-3 fatty acids 1000 MG capsule Take 1 g by mouth daily.     [provider]  metoprolol tartrate (LOPRESSOR) 25 MG tablet Take 1 tablet (25 mg total) by mouth 2 (two) times daily. 03/31/16   Demetrios Loll, MD  Multiple Vitamin (MULTIVITAMIN) tablet Take 1 tablet by mouth daily.    [provider]  niacin 500 MG tablet Take 1,500 mg by mouth at bedtime.    [provider]  ondansetron (ZOFRAN ODT) 4 MG disintegrating tablet Take 1 tablet (4 mg total) by mouth every 8 (eight) hours as needed. 01/10/19   Eula Listen, MD  pantoprazole (PROTONIX) 20 MG tablet Take 1 tablet (20 mg total) by mouth daily. 05/28/20 05/28/21  Lavonia Drafts, MD  simvastatin (ZOCOR) 20 MG tablet Take 10 mg by mouth every evening.     [provider]  sucralfate (CARAFATE) 1 g tablet Take 1 tablet (1 g total) by mouth 4 (four) times daily. 05/28/20 06/27/20  Lavonia Drafts, MD  Tamsulosin HCl (FLOMAX) 0.4 MG CAPS Take 0.4 mg by mouth daily. Take 30 minutes after same meal every day.    [provider]  traMADol (ULTRAM) 50 MG tablet Take 1 tablet (50 mg total) by mouth every 8 (eight) hours as needed for pain. Patient taking differently: Take 100 mg by mouth 3 (three) times daily as  needed for moderate pain or severe pain.  01/16/13   Kirsteins, Luanna Salk, MD  vitamin B-12 (CYANOCOBALAMIN) 1000 MCG tablet Take 1,000 mcg by mouth daily.    [provider]     Allergies Fentanyl and Ibuprofen  Family History  Problem Relation Age of Onset  . Dementia Mother   . Colon cancer Father     Social History Social History   Tobacco Use  . Smoking status: Former Smoker    Quit date: 11/23/1967    Years since quitting: 52.5  . Smokeless tobacco: Never Used  Substance Use Topics  . Alcohol use: No  . Drug use: No    Review of Systems  Constitutional: No fever/chills Eyes: No visual changes.  ENT: No sore throat. Cardiovascular: Denies chest pain. Respiratory: Denies shortness of breath. Gastrointestinal: As above Genitourinary: Negative for dysuria. Musculoskeletal: Negative for back pain. Skin: Negative for rash. Neurological: Negative for headaches    ____________________________________________   PHYSICAL EXAM:  VITAL SIGNS: ED Triage Vitals  Enc Vitals Group     BP 05/28/20 1448 (!) 181/72     Pulse Rate 05/28/20 1448 75     Resp 05/28/20 1448 17     Temp 05/28/20 1448 98.7 F (37.1 C)     Temp Source 05/28/20 1448 Oral     SpO2 05/28/20 1448 95 %     Weight 05/28/20 1449 90.7 kg (200 lb)     Height 05/28/20 1449 1.727 m (5\' 8" )     Head Circumference --      Peak Flow --      Pain Score 05/28/20 1501 10     Pain Loc --      Pain Edu? --      Excl. in [place]? --     Constitutional: Alert and oriented.  . Nose: No congestion/rhinnorhea. Mouth/Throat: Mucous membranes are moist.   Neck:  Painless ROM Cardiovascular: Normal rate, regular rhythm. Grossly normal heart sounds.  Good peripheral circulation. Respiratory: Normal respiratory effort.  No retractions. Lungs CTAB. Gastrointestinal: Soft, mild distention, mild tenderness epigastrically, no CVA tenderness  Musculoskeletal: Warm and well perfused Neurologic:  Normal speech and  language. No gross focal neurologic deficits are appreciated.  Skin:  Skin is warm, dry and intact. No rash noted. Psychiatric: Mood and affect are normal. Speech and behavior are normal.  ____________________________________________   LABS (all labs ordered are listed, but only abnormal results are displayed)  Labs Reviewed  COMPREHENSIVE METABOLIC PANEL - Abnormal; Notable for the following components:      Result Value   Chloride 97 (*)    Glucose, Bld 232 (*)    BUN 25 (*)    Creatinine, Ser 1.75 (*)    Total Protein 8.5 (*)    GFR calc non Af Amer 36 (*)    GFR calc Af Amer 42 (*)  All other components within normal limits  CBC - Abnormal; Notable for the following components:   WBC 15.1 (*)    All other components within normal limits  URINALYSIS, COMPLETE (UACMP) WITH MICROSCOPIC - Abnormal; Notable for the following components:   Color, Urine YELLOW (*)    APPearance CLEAR (*)    Glucose, UA >=500 (*)    Protein, ur >=300 (*)    All other components within normal limits  LIPASE, BLOOD   ____________________________________________  EKG  ED ECG REPORT I, Lavonia Drafts, the attending physician, personally viewed and interpreted this ECG.  Date: 05/28/2020  Rhythm: normal sinus rhythm QRS Axis: normal Intervals: First-degree AV block, right bundle branch block ST/T Wave abnormalities: normal Narrative Interpretation: no evidence of acute ischemia ____________________________________________  RADIOLOGY  CT abdomen pelvis without acute abnormality ____________________________________________   PROCEDURES  Procedure(s) performed: No  Procedures   Critical Care performed: No ____________________________________________   INITIAL IMPRESSION / ASSESSMENT AND PLAN / ED COURSE  Pertinent labs & imaging results that were available during my care of the patient were reviewed by me and considered in my medical decision making (see chart for  details).  Patient presents with abdominal distention, some epigastric pain and some pain in the right flank.  Given his history suspicious for pancreatitis, colitis, diverticulitis, ureterolithiasis also a possibility.  Lab work significant for elevated white blood cell count.  Lipase is normal today.  Mild elevation of BUN and creatinine consistent with dehydration, will give IV morphine, IV Zofran, IV fluids obtain CT abdomen pelvis and reevaluate.  Patient is feeling much better after treatment.  CT scan is quite reassuring, no acute abnormalities.  Patient continues to feel very mild burning in his epigastrium, suspicious for gastritis/PUD.  Will treat with Protonix, Carafate, close outpatient follow-up with GI, strict return precautions.  Patient agrees with plan    ____________________________________________   FINAL CLINICAL IMPRESSION(S) / ED DIAGNOSES  Final diagnoses:  Epigastric pain        Note:  This document was prepared using Dragon voice recognition software and may include unintentional dictation errors.   Lavonia Drafts, MD 05/28/20 Despina Pole

## 2020-05-28 NOTE — ED Notes (Signed)
Pt c/o RUQ pain radiating around to the back since 7am this morning with N/V. States he has a hx of pancreatitis last was 2017.Marland Kitchen

## 2020-06-02 DIAGNOSIS — K2990 Gastroduodenitis, unspecified, without bleeding: Secondary | ICD-10-CM | POA: Diagnosis not present

## 2020-06-02 DIAGNOSIS — R1084 Generalized abdominal pain: Secondary | ICD-10-CM | POA: Diagnosis not present

## 2020-06-13 DIAGNOSIS — K2900 Acute gastritis without bleeding: Secondary | ICD-10-CM | POA: Diagnosis not present

## 2020-06-24 DIAGNOSIS — R5383 Other fatigue: Secondary | ICD-10-CM | POA: Diagnosis not present

## 2020-06-24 DIAGNOSIS — K2970 Gastritis, unspecified, without bleeding: Secondary | ICD-10-CM | POA: Diagnosis not present

## 2020-10-01 DIAGNOSIS — E782 Mixed hyperlipidemia: Secondary | ICD-10-CM | POA: Diagnosis not present

## 2020-10-01 DIAGNOSIS — R739 Hyperglycemia, unspecified: Secondary | ICD-10-CM | POA: Diagnosis not present

## 2020-10-01 DIAGNOSIS — F1190 Opioid use, unspecified, uncomplicated: Secondary | ICD-10-CM | POA: Diagnosis not present

## 2020-10-01 DIAGNOSIS — Z125 Encounter for screening for malignant neoplasm of prostate: Secondary | ICD-10-CM | POA: Diagnosis not present

## 2020-10-02 DIAGNOSIS — F1190 Opioid use, unspecified, uncomplicated: Secondary | ICD-10-CM | POA: Diagnosis not present

## 2020-10-02 DIAGNOSIS — E782 Mixed hyperlipidemia: Secondary | ICD-10-CM | POA: Diagnosis not present

## 2020-10-02 DIAGNOSIS — R739 Hyperglycemia, unspecified: Secondary | ICD-10-CM | POA: Diagnosis not present

## 2020-10-02 DIAGNOSIS — Z125 Encounter for screening for malignant neoplasm of prostate: Secondary | ICD-10-CM | POA: Diagnosis not present

## 2020-10-08 DIAGNOSIS — M542 Cervicalgia: Secondary | ICD-10-CM | POA: Diagnosis not present

## 2020-10-08 DIAGNOSIS — Z23 Encounter for immunization: Secondary | ICD-10-CM | POA: Diagnosis not present

## 2020-10-08 DIAGNOSIS — E782 Mixed hyperlipidemia: Secondary | ICD-10-CM | POA: Diagnosis not present

## 2020-10-08 DIAGNOSIS — E1121 Type 2 diabetes mellitus with diabetic nephropathy: Secondary | ICD-10-CM | POA: Diagnosis not present

## 2020-10-08 DIAGNOSIS — N184 Chronic kidney disease, stage 4 (severe): Secondary | ICD-10-CM | POA: Diagnosis not present

## 2020-10-08 DIAGNOSIS — Z0000 Encounter for general adult medical examination without abnormal findings: Secondary | ICD-10-CM | POA: Diagnosis not present

## 2022-07-19 ENCOUNTER — Other Ambulatory Visit: Payer: Self-pay | Admitting: Family Medicine

## 2022-07-19 DIAGNOSIS — M5416 Radiculopathy, lumbar region: Secondary | ICD-10-CM

## 2022-08-03 ENCOUNTER — Ambulatory Visit
Admission: RE | Admit: 2022-08-03 | Discharge: 2022-08-03 | Disposition: A | Discharge status: Home / Self Care | Payer: Medicare Other | Source: Ambulatory Visit | Attending: Family Medicine | Admitting: Family Medicine

## 2022-08-03 DIAGNOSIS — M5416 Radiculopathy, lumbar region: Secondary | ICD-10-CM

## 2023-06-24 ENCOUNTER — Other Ambulatory Visit: Payer: Self-pay | Admitting: Family Medicine

## 2023-06-24 DIAGNOSIS — M5412 Radiculopathy, cervical region: Secondary | ICD-10-CM

## 2023-07-09 ENCOUNTER — Ambulatory Visit
Admission: RE | Admit: 2023-07-09 | Discharge: 2023-07-09 | Disposition: A | Discharge status: Home / Self Care | Payer: Medicare Other | Source: Ambulatory Visit | Attending: Family Medicine | Admitting: Family Medicine

## 2023-07-09 DIAGNOSIS — M5412 Radiculopathy, cervical region: Secondary | ICD-10-CM

## 2023-12-08 ENCOUNTER — Other Ambulatory Visit: Payer: Self-pay | Admitting: Internal Medicine

## 2023-12-08 DIAGNOSIS — N139 Obstructive and reflux uropathy, unspecified: Secondary | ICD-10-CM

## 2023-12-09 ENCOUNTER — Ambulatory Visit
Admission: RE | Admit: 2023-12-09 | Discharge: 2023-12-09 | Disposition: A | Discharge status: Home / Self Care | Payer: Medicare Other | Source: Ambulatory Visit | Attending: Internal Medicine | Admitting: Internal Medicine

## 2023-12-09 DIAGNOSIS — N139 Obstructive and reflux uropathy, unspecified: Secondary | ICD-10-CM
